# Patient Record
Sex: Female | Born: 1935 | Race: White | Hispanic: No | Smoking: Never smoker
Health system: Southern US, Community
[De-identification: ages and names within clinical notes are randomized; demographics above are authoritative.]

## PROBLEM LIST (undated history)

## (undated) DIAGNOSIS — I10 Essential (primary) hypertension: Secondary | ICD-10-CM

## (undated) DIAGNOSIS — F419 Anxiety disorder, unspecified: Secondary | ICD-10-CM

## (undated) DIAGNOSIS — E785 Hyperlipidemia, unspecified: Secondary | ICD-10-CM

## (undated) DIAGNOSIS — A0472 Enterocolitis due to Clostridium difficile, not specified as recurrent: Secondary | ICD-10-CM

---

## 2015-08-07 IMAGING — MG MAMMOGRAPHY SCREENING BILATERAL 3D TOMOSYNTHESIS WITH CAD
12 of 16 series · 12 of 32 positions shown · non-contrast
Comparison: Exams dating back to [id number]
BREAST DENSITY: (Level B) There are scattered areas of fibroglandular density.

MAMMOGRAPHY SCREENING 3D TOMOSYNTHESIS WITH CAD, 08/07/2015 [DATE]:
CLINICAL INDICATION: Screening
TECHNIQUE: Digital mammograms and 3-D Tomosynthesis were obtained. These were
interpreted both primarily and with the aid of computer-aided detection
system.

[L CC synth-2D]
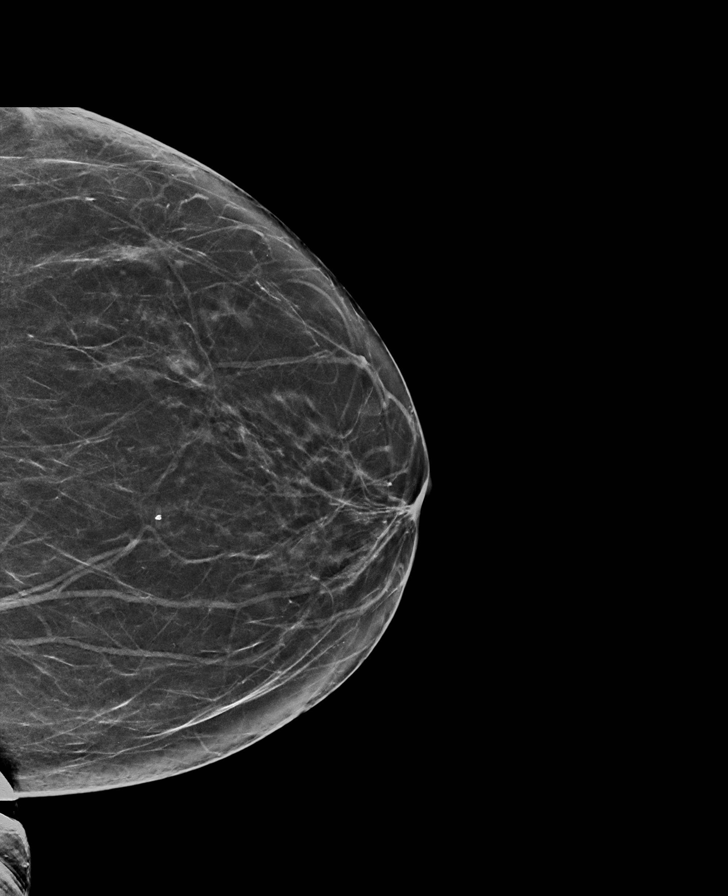

[L CC]
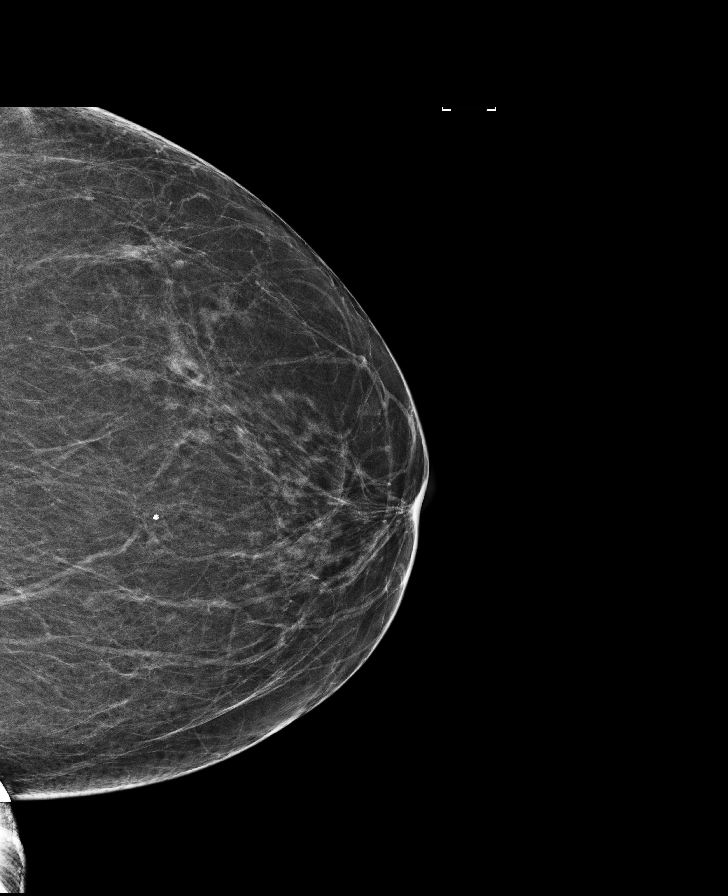

[R CC]
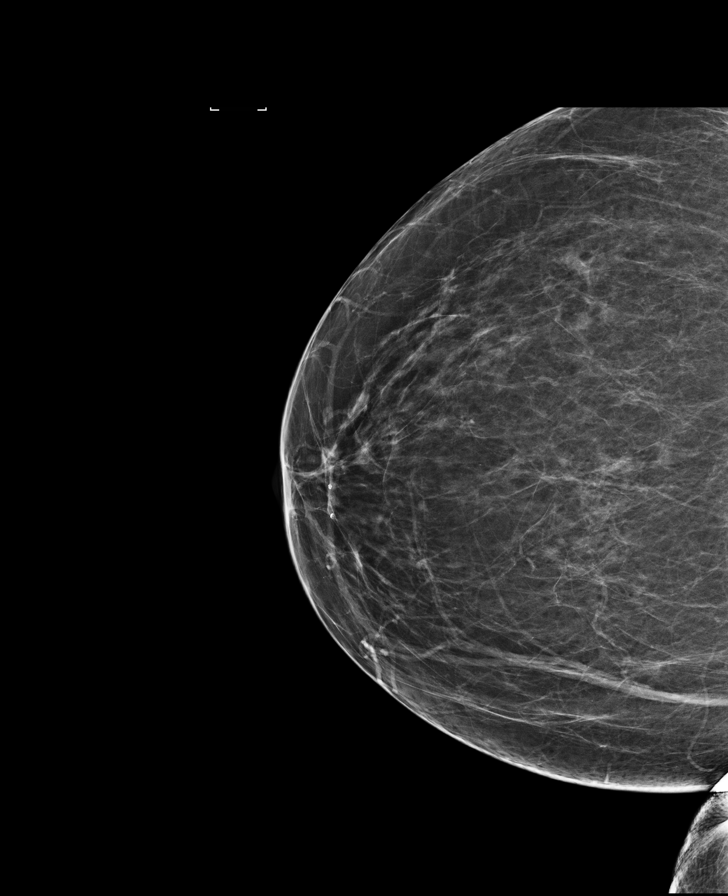

[L MLO]
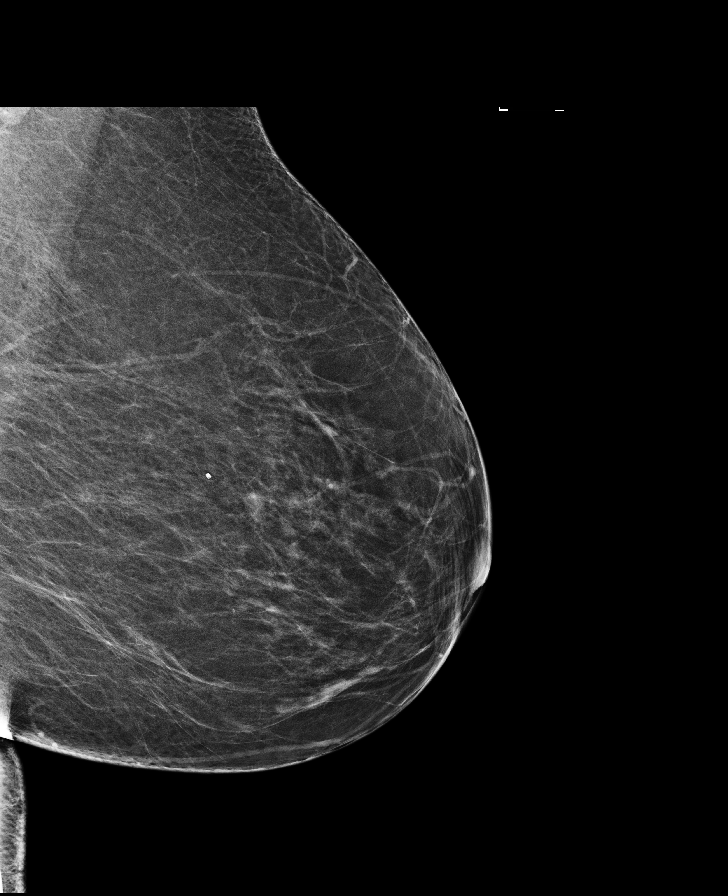

[R CC synth-2D]
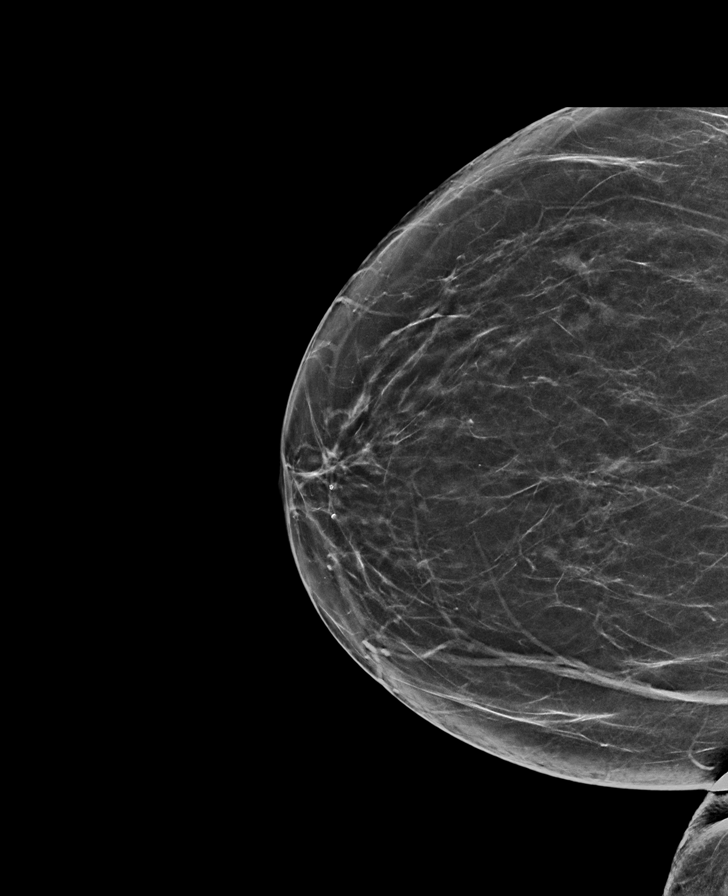

[L MLO synth-2D]
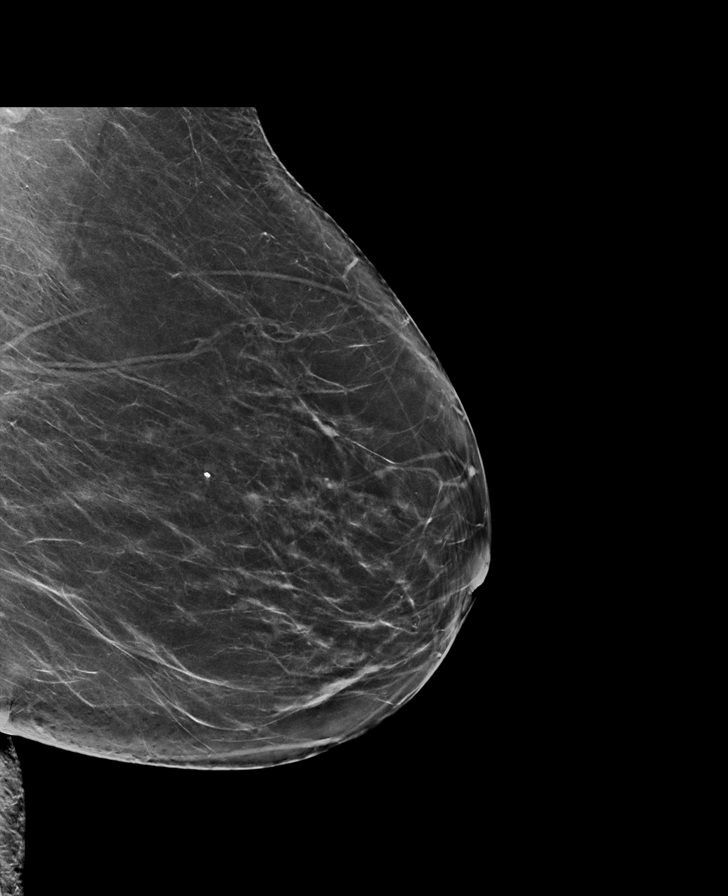

[R MLO synth-2D]
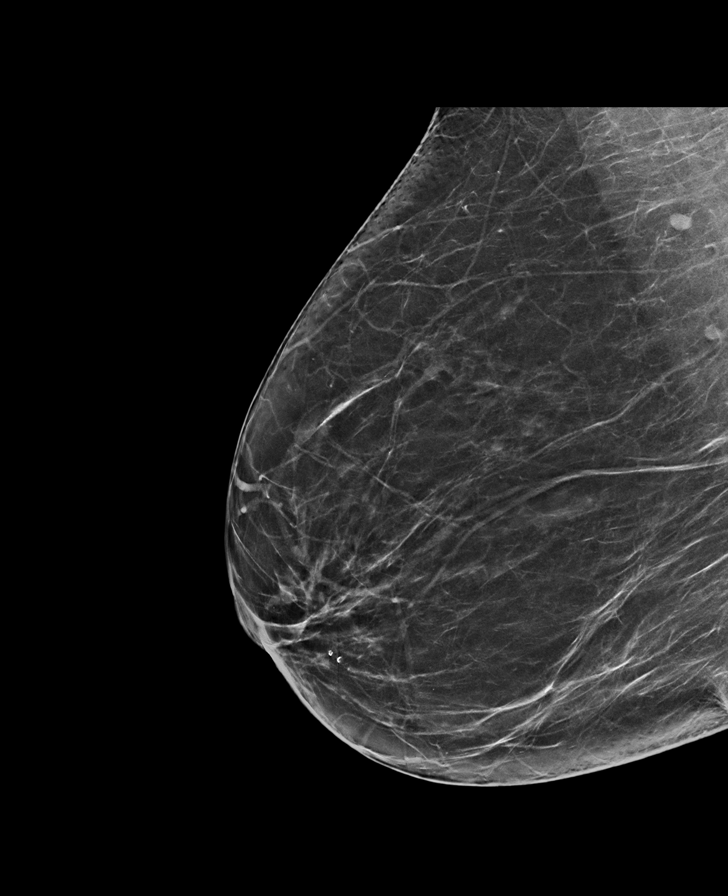

[R MLO]
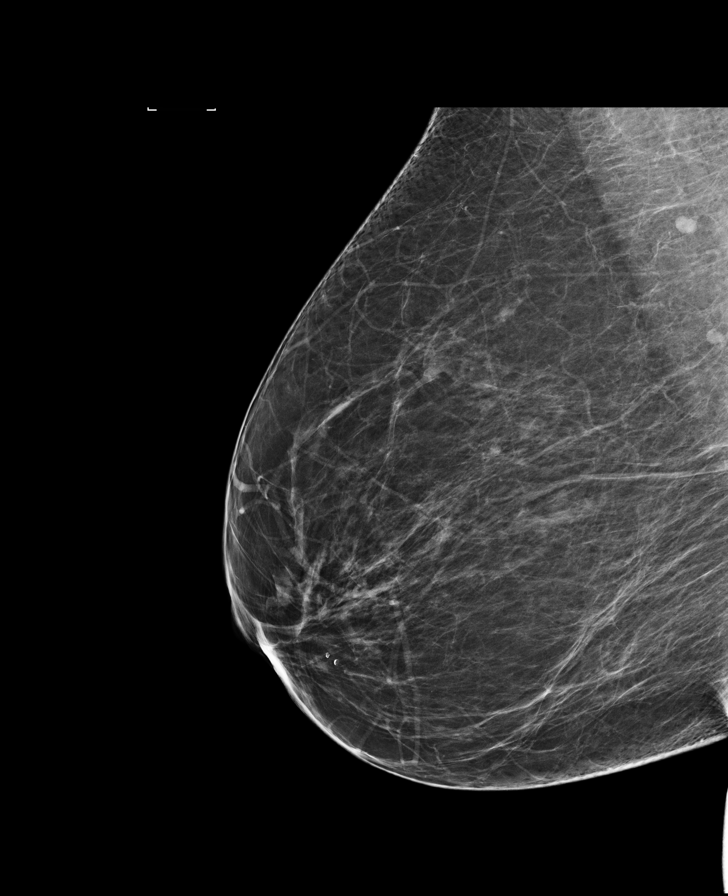

[R MLO tomo]
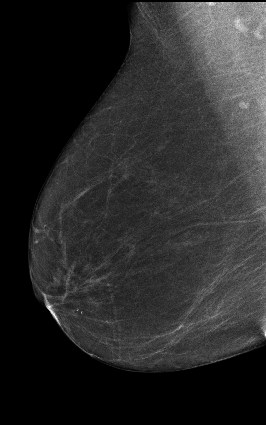

[L CC tomo]
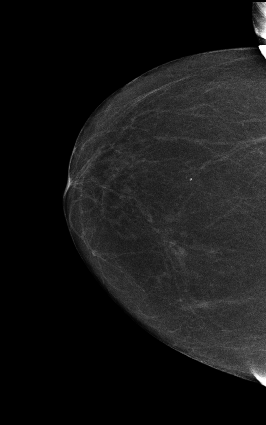

[R CC tomo]
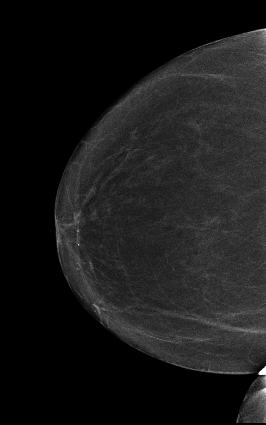

[L MLO tomo]
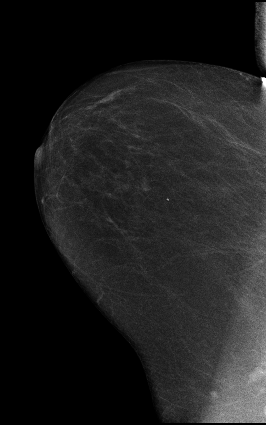

[12 of 32 positions shown; findings below may reference images not displayed]

FINDINGS: There is no evidence of mass or malignant appearing
microcalcifications
seen on today's examination. No skin thickening is identified. There is
thought
to be an overall stable mammographic appearance.
IMPRESSION: ( BI-RADS 2) Benign findings. Routine mammographic follow-up is recommended.

## 2019-08-26 IMAGING — MG MAMMOGRAPHY SCREENING BILATERAL 3D TOMOSYNTHESIS WITH CAD
8 series · 8 of 24 positions shown · non-contrast
Comparison: 07/29/2015 and 12/28/2013. 
BREAST DENSITY: (Level B) There are scattered areas of fibroglandular density.

MAMMOGRAPHY SCREENING BILATERAL 3D TOMOSYNTHESIS WITH CAD, 08/26/2019 [DATE]: 
CLINICAL INDICATION: Screening exam. History of prior left breast excisional 
biopsy.
TECHNIQUE: Digital bilateral mammograms and 3-D Tomosynthesis were obtained. 
These were interpreted both primarily and with the aid of computer-aided 
detection system.

[L CC]
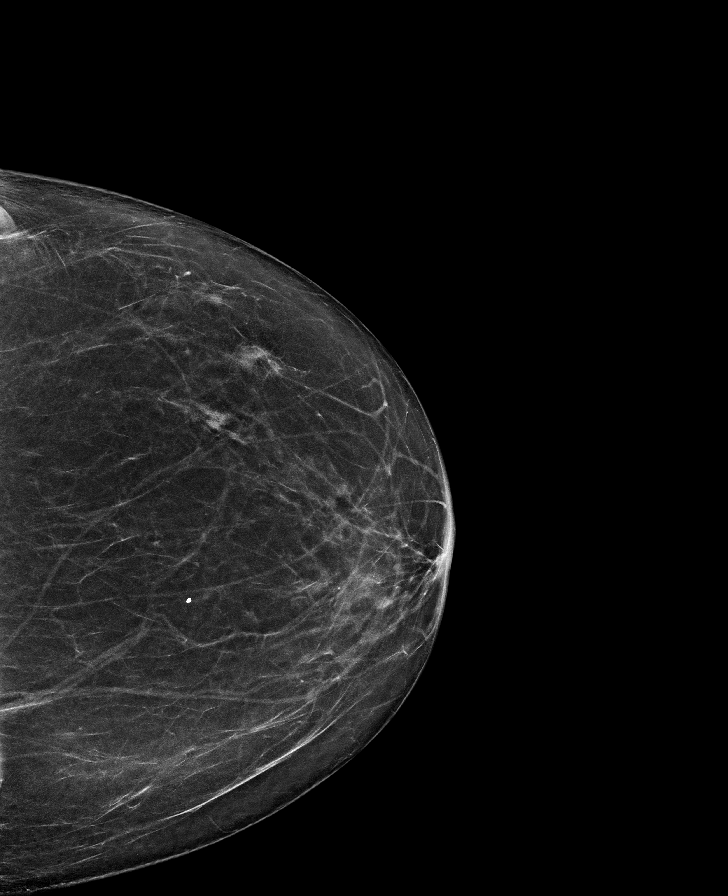

[L MLO]
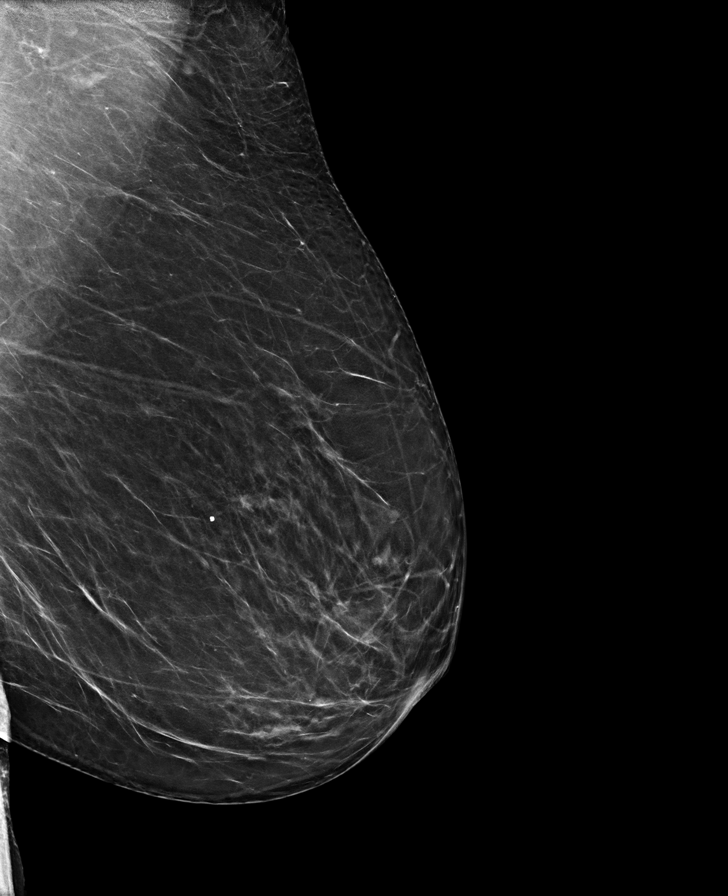

[R MLO]
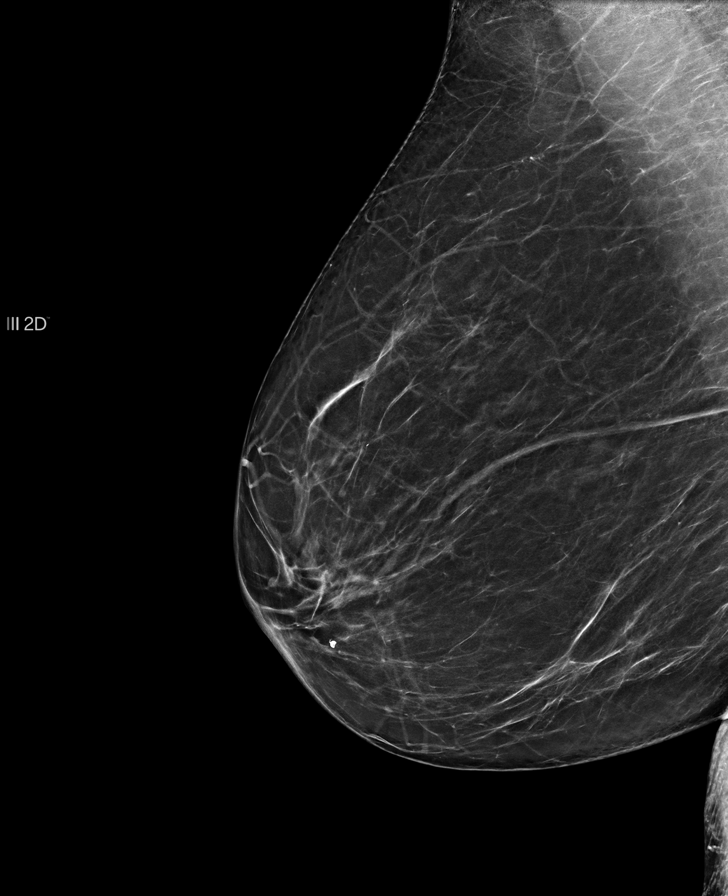

[R CC]
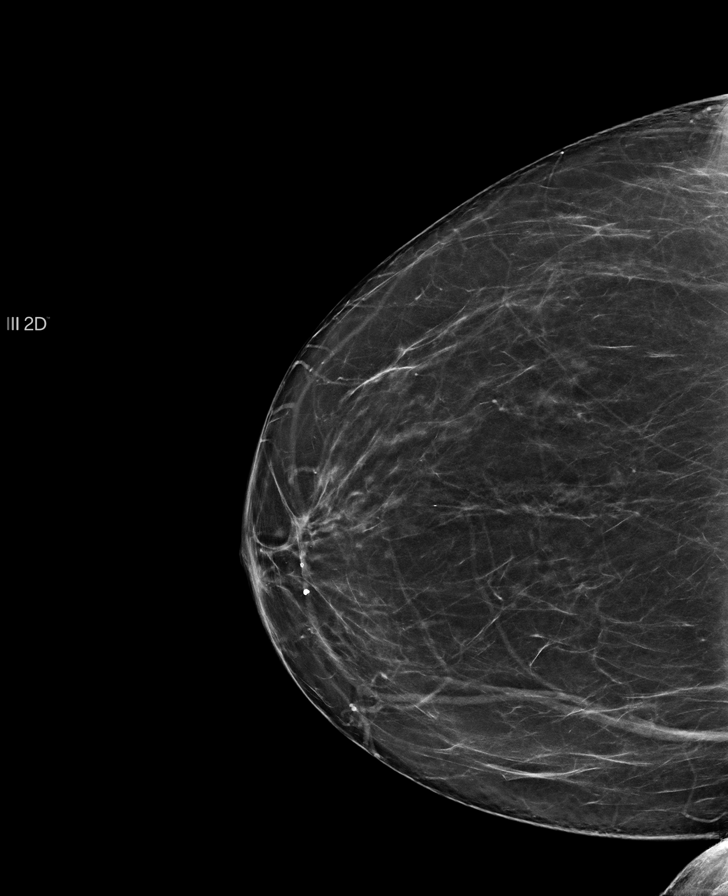

[L MLO tomo · tomo slice 37/73.0]
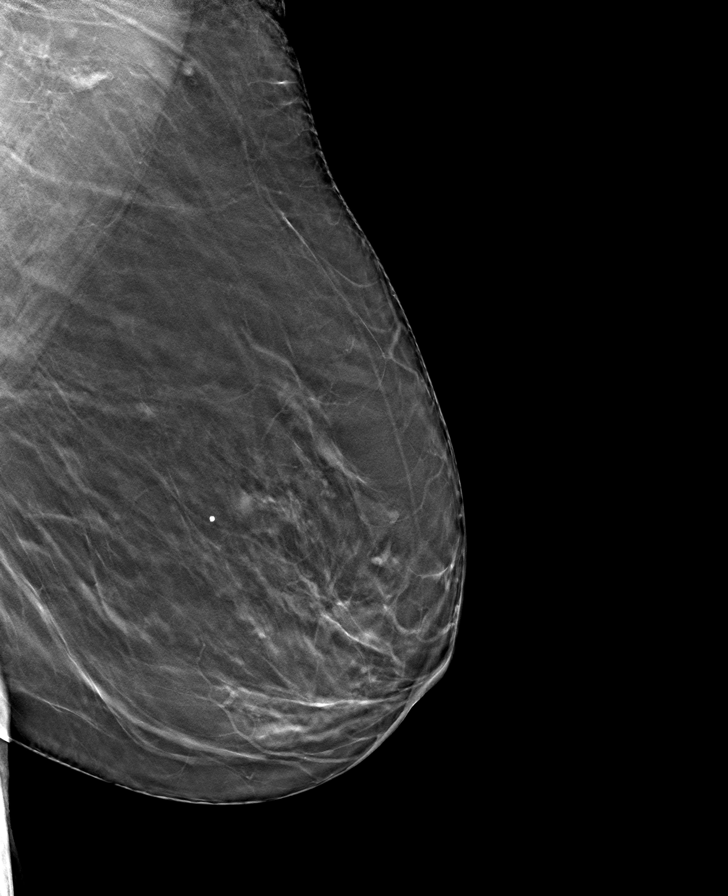

[L CC tomo · tomo slice 33/66.0]
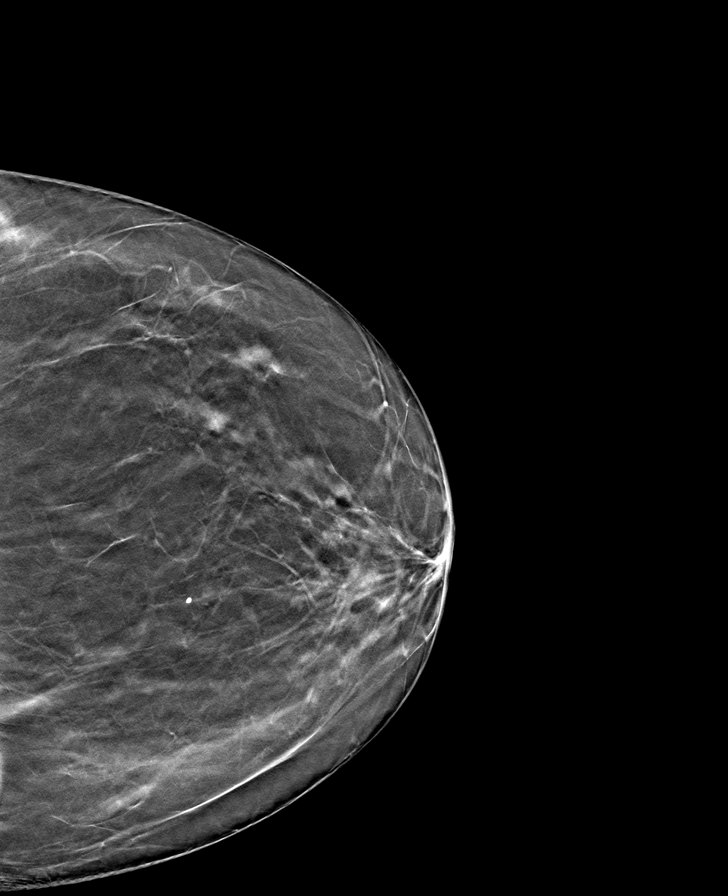

[R CC tomo · tomo slice 34/67.0]
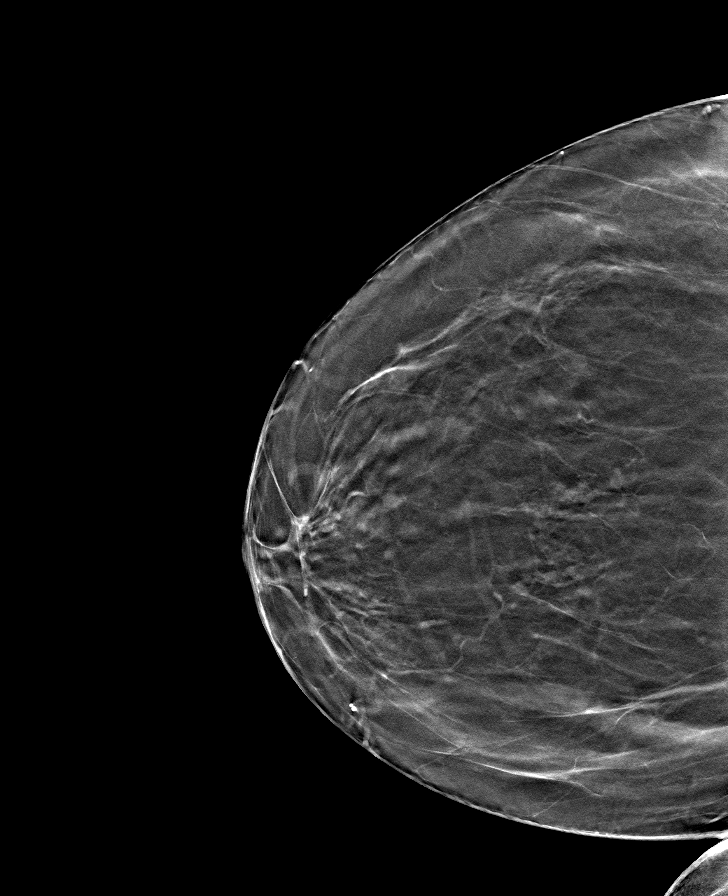

[R MLO tomo · tomo slice 35/69.0]
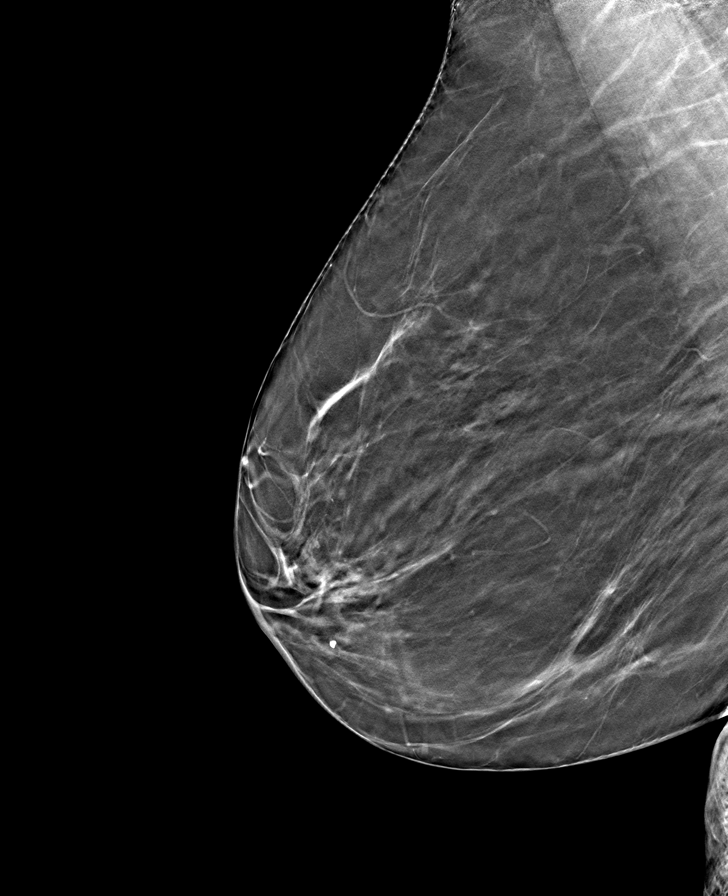

[8 of 24 positions shown; findings below may reference images not displayed]

FINDINGS: Overall stable mammographic appearance. No mammographic findings 
suggestive for malignancy.
IMPRESSION: ( BI-RADS 2) Benign findings. Routine mammographic follow-up is recommended.

## 2020-10-18 IMAGING — CT CT LUMBAR SPINE WITHOUT CONTRAST
3 series · 10 of 33 positions shown, 12 images · non-contrast
Comparison: There are no previous exams available for comparison.

CT LUMBAR SPINE WITHOUT CONTRAST, 10/18/2020 [DATE]: 
CLINICAL INDICATION: Progressive low back pain. 
A search for DICOM formatted images was conducted for prior CT imaging studies 
completed at a non-affiliated media free facility.
TECHNIQUE: The lumbar spine was scanned from T12 through mid-sacrum without 
contrast on a high-resolution CT scanner using dose reduction techniques. 
Routing MPR reconstructions were performed.

[Series 3: l spine 2.0 b31s · axial · 0.27mm/px · z∈[-240,-132]mm · 2 of 119 slices shown, 3 images]
[im 37/119  soft-tissue]
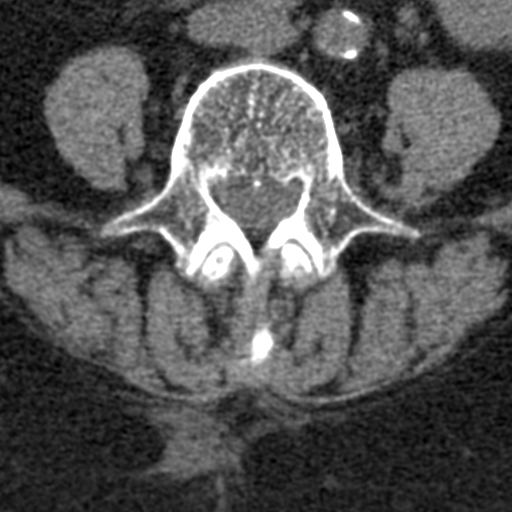
[im 37/119  bone]
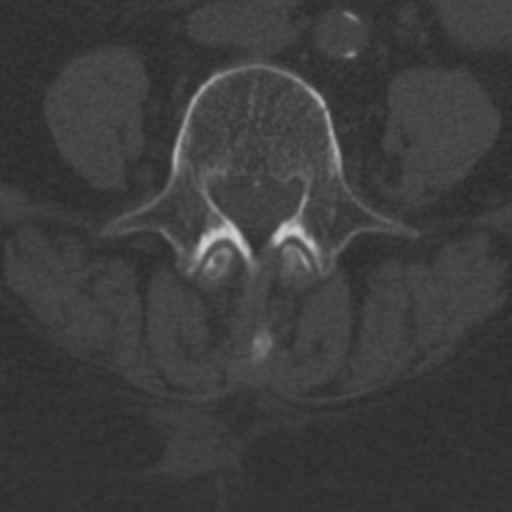
[im 91/119  bone]
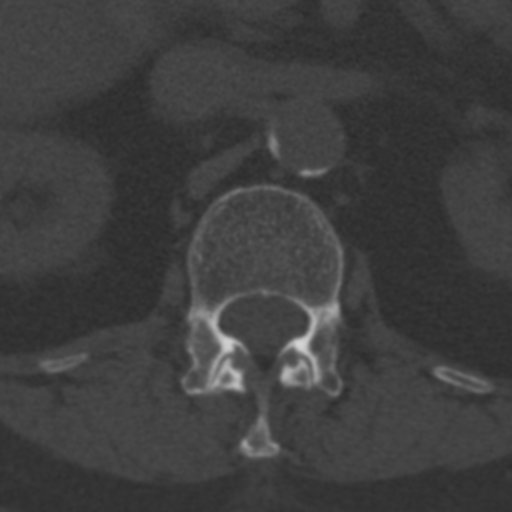

[Series 5: coronal · coronal · 0.28mm/px · 3 of 70 slices shown]
[im 14/70  bone]
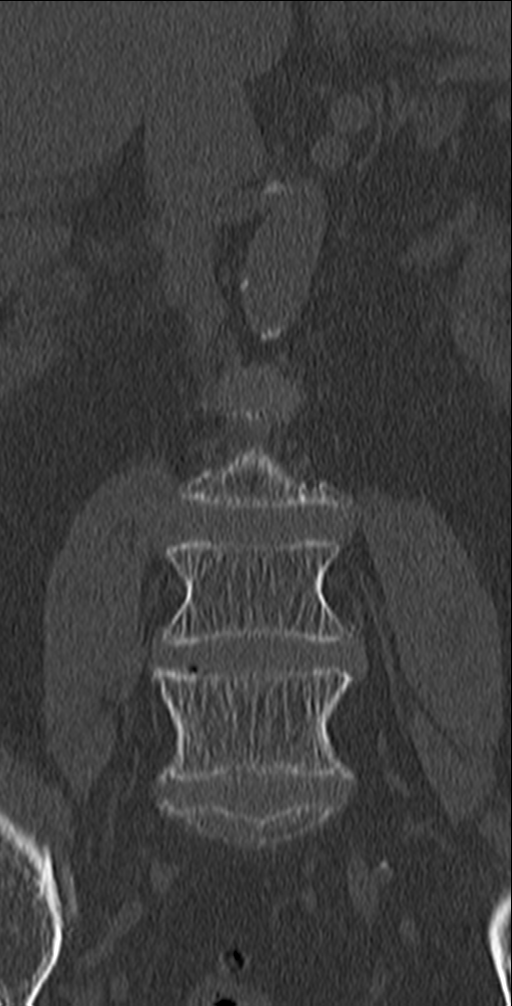
[im 28/70  bone]
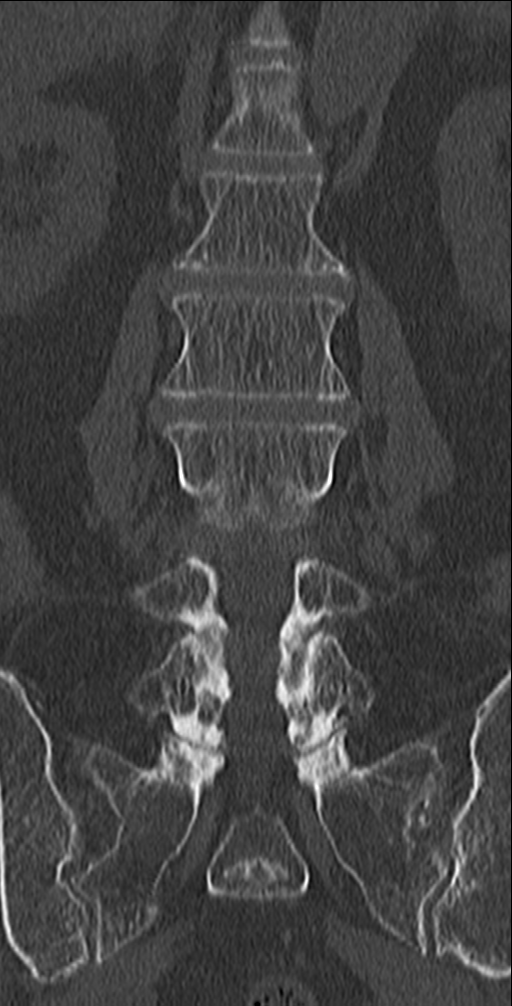
[im 42/70  bone]
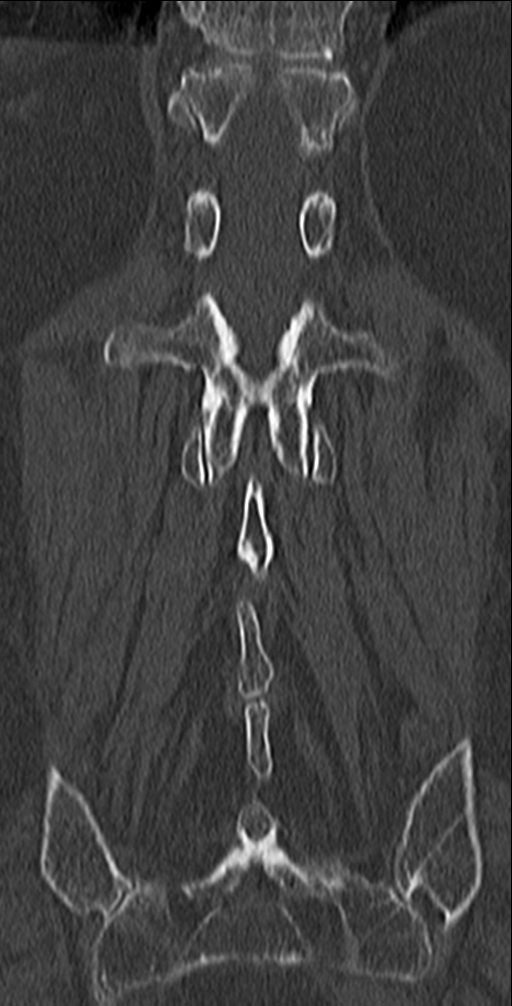

[Series 7: sagittal st · sagittal · 0.30mm/px · 5 of 71 slices shown, 6 images]
[im 24/71  bone]
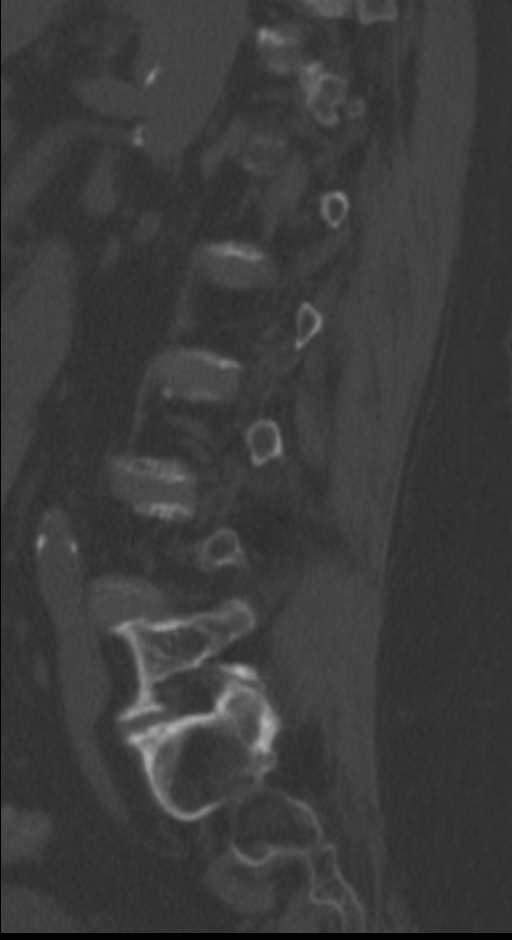
[im 30/71  bone]
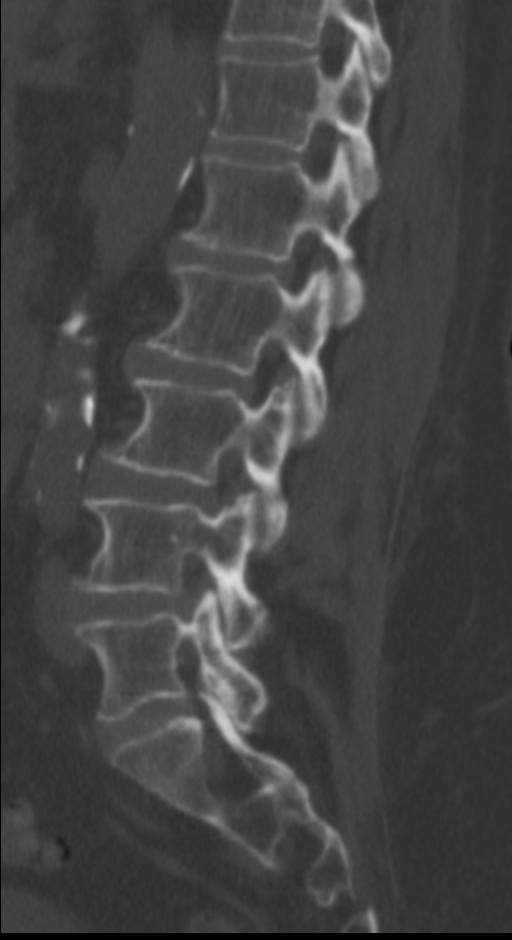
[im 36/71  soft-tissue]
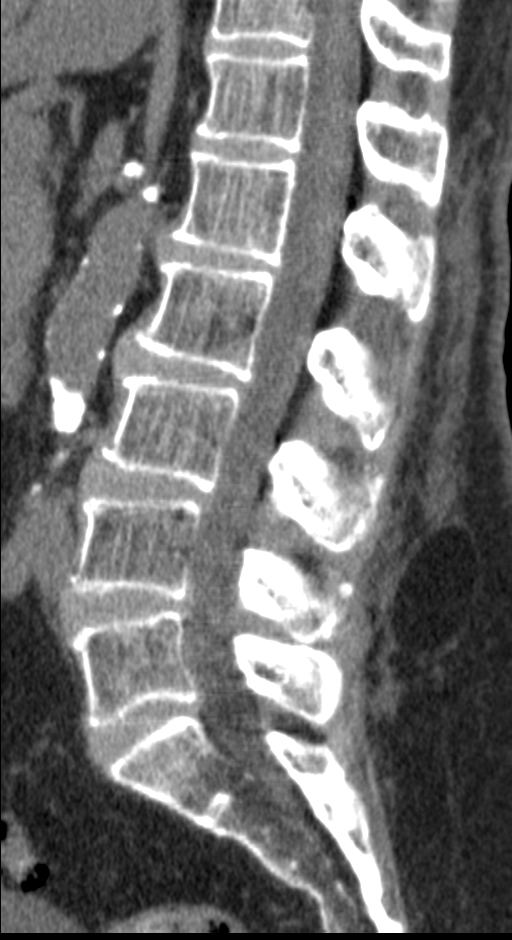
[im 36/71  bone]
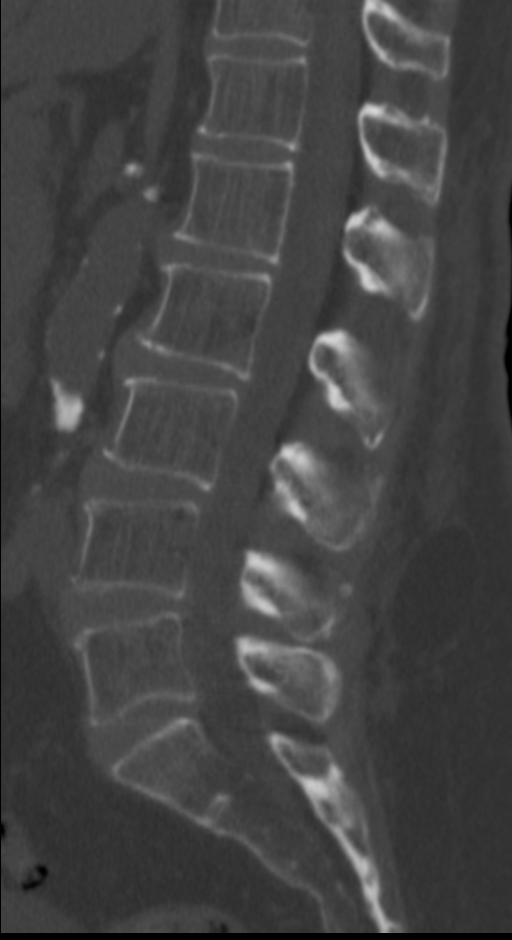
[im 41/71  bone]
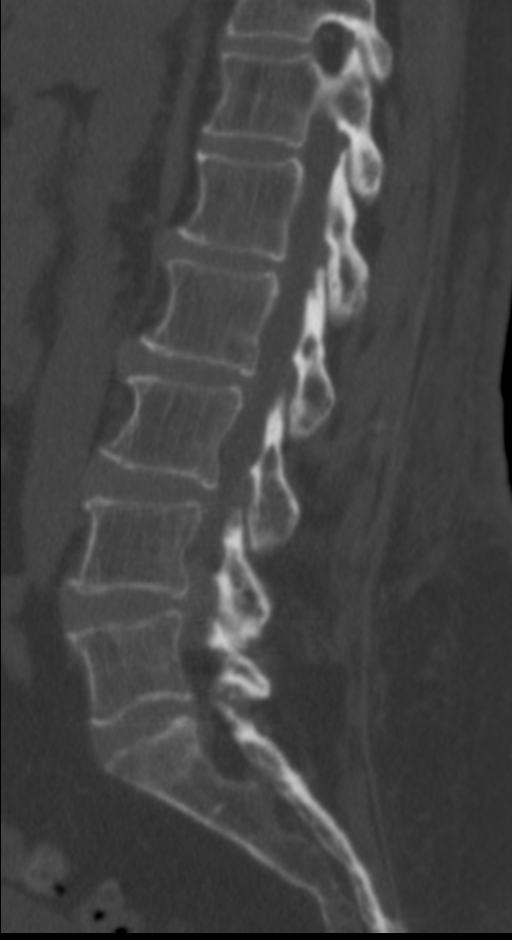
[im 47/71  bone]
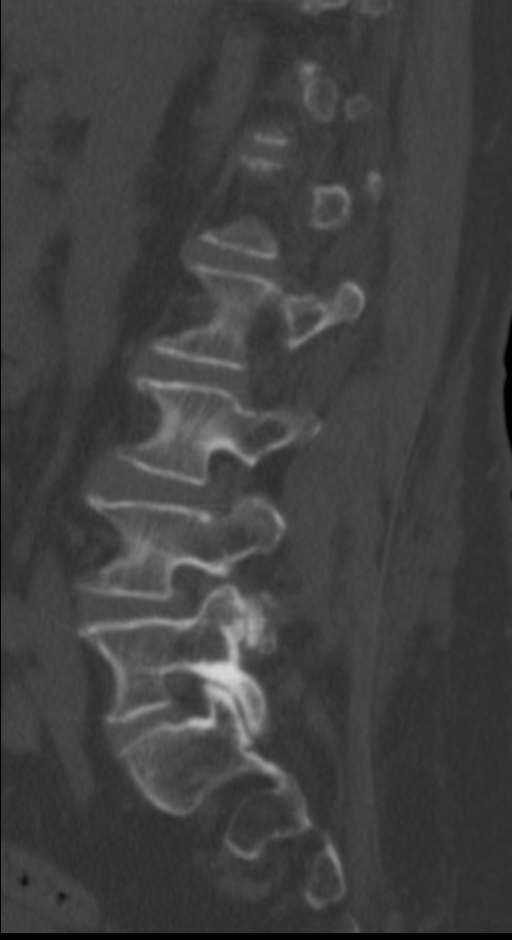

[10 of 33 positions shown; findings below may reference images not displayed]

FINDINGS: The vertebral bodies are diffusely osteopenic but normal in height and 
alignment. No vertebral body fracture. No subluxation. No osteolytic or 
osteosclerotic lesion. Included portions of the SI joints are negative. The 
aorta is normal in caliber. Paraspinal soft tissues are negative.. Mild/moderate 
degenerative change sacroiliac joint spaces. Right renal cyst. 
Disc levels are as follows given the intrinsic resolution of the CT exam: 
Mild to moderate spondylosis greatest in the posterior elements.
IMPRESSION: 1.  No fracture or subluxation. 
2.  Mild to moderate spondylosis greatest in the posterior elements. 
RADIATION DOSE REDUCTION: All CT scans are performed using radiation dose 
reduction techniques, when applicable.  Technical factors are evaluated and 
adjusted to ensure appropriate moderation of exposure.  Automated dose 
management technology is applied to adjust the radiation doses to minimize 
exposure while achieving diagnostic quality images.

## 2020-10-18 IMAGING — DX LUMBAR SPINE AP & LAT WITH FLEXION AND EXTENSION
1 series · 4 of 4 positions shown · non-contrast
Comparison: None.

LUMBAR SPINE AP \T\T\T\ LAT WITH FLEXION AND EXTENSION, 10/18/2020 [DATE]: 
CLINICAL INDICATION: Back pain.

[Series 1: AP · 0.14mm/px · 4 of 4 slices shown]
[im 1/4]
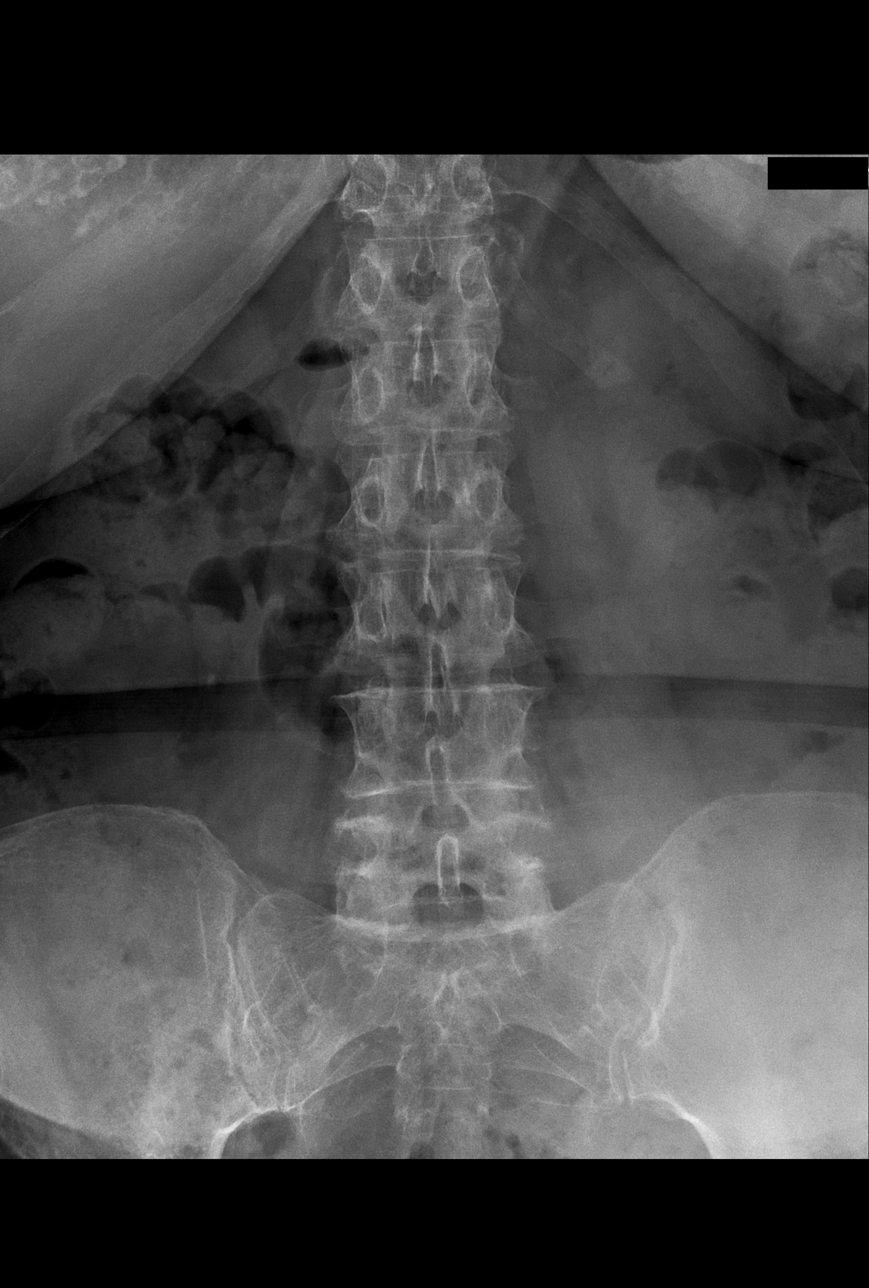
[im 2/4]
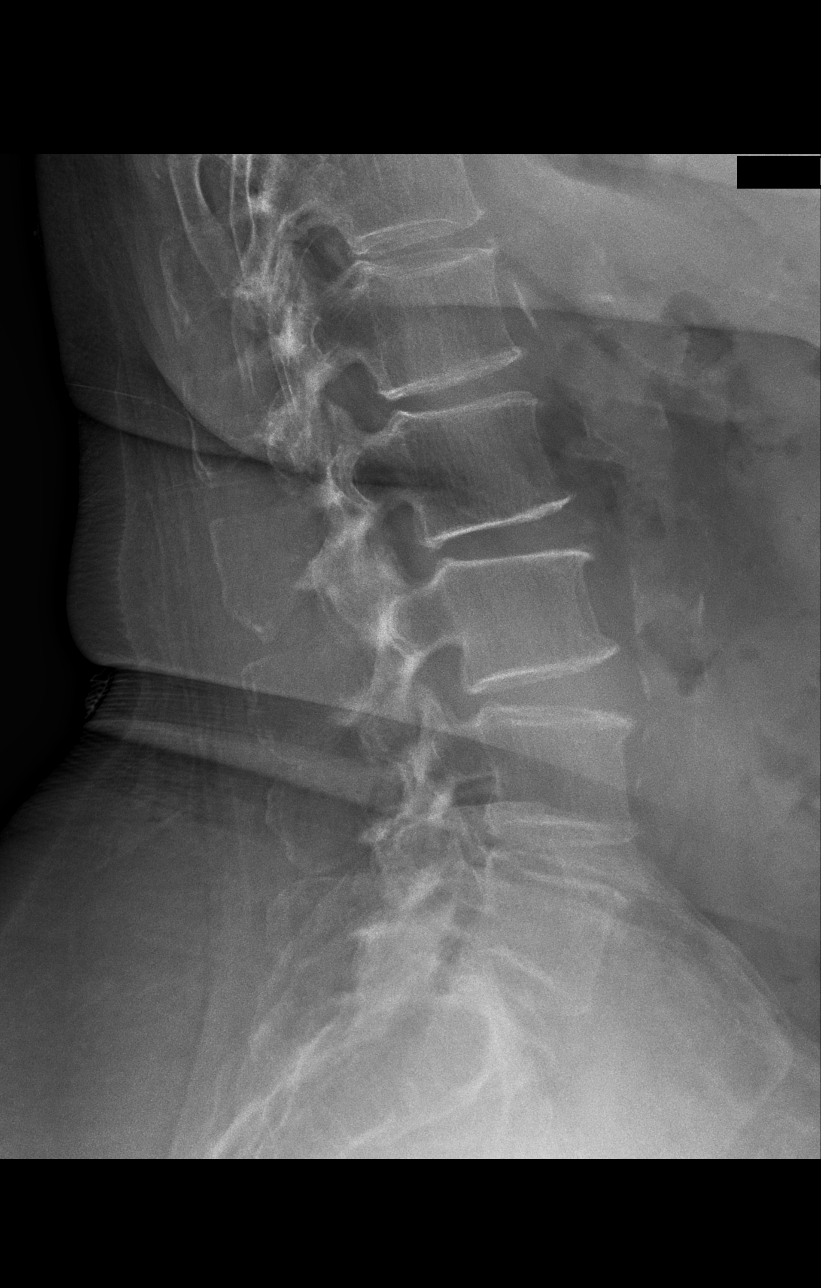
[im 3/4]
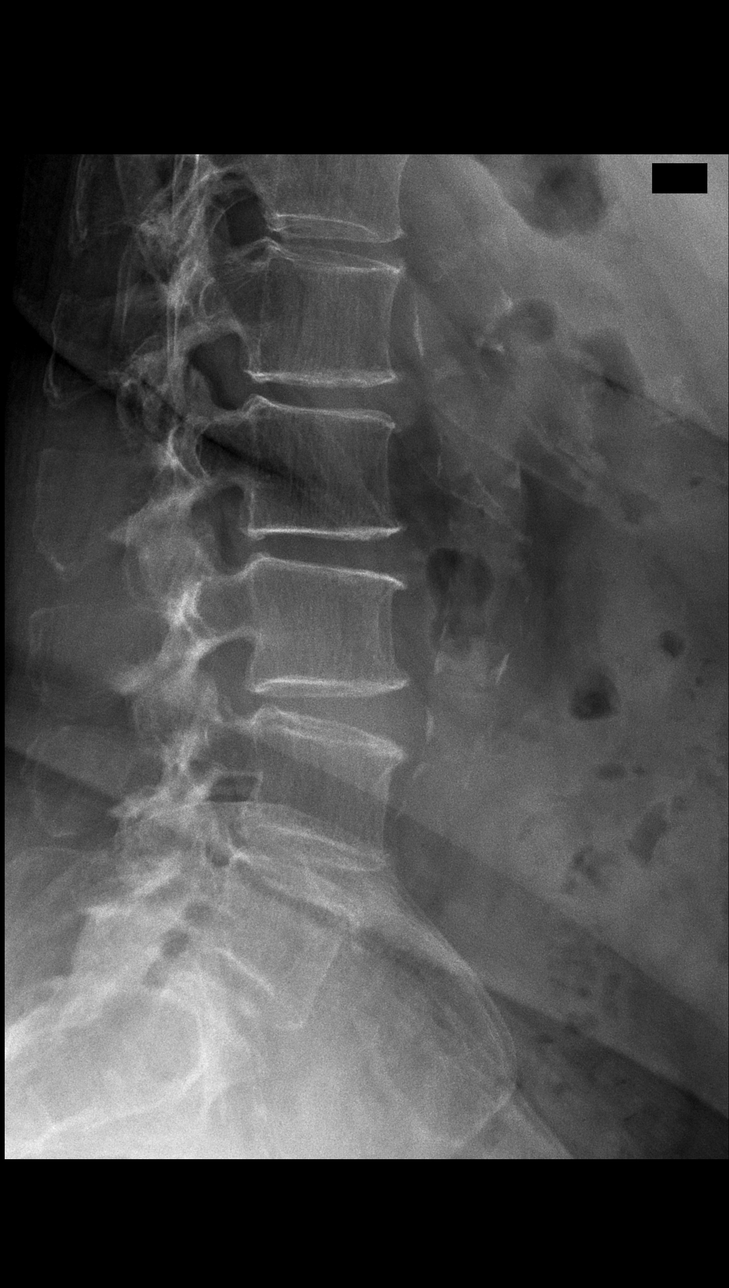
[im 4/4]
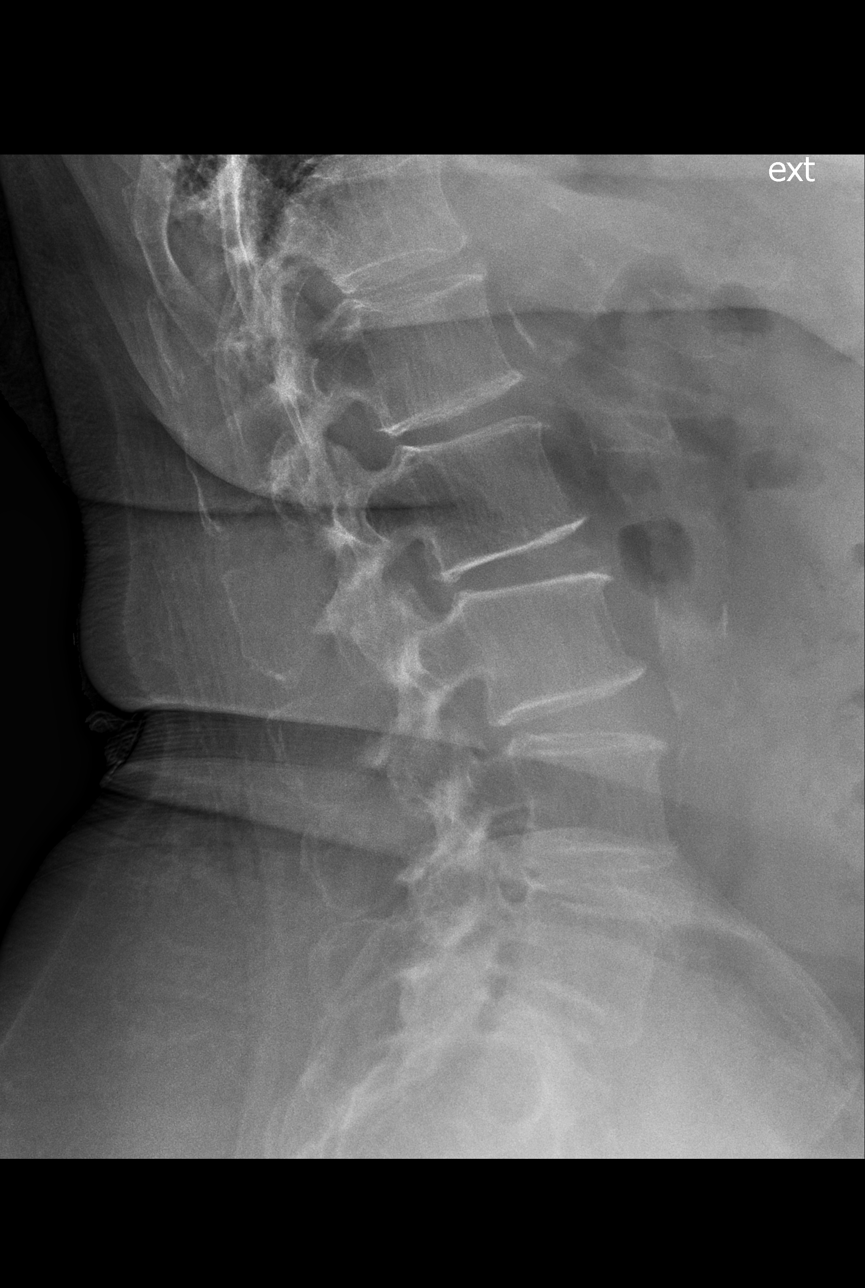

[4 of 4 positions shown; findings below may reference images not displayed]

FINDINGS: Bones are diffusely osteopenic. Mild to moderate spondylosis. No fracture or 
subluxation. Moderate vascular calcification. Mild/moderate degenerative change 
sacroiliac joint spaces.
IMPRESSION: Mild to moderate spondylosis. No fracture or subluxation.

## 2020-10-18 IMAGING — MR MRI LUMBAR SPINE WITHOUT CONTRAST
4 of 6 series · 23 of 48 positions shown · IV contrast (gadolinium)
Comparison: None.

MRI LUMBAR SPINE WITHOUT CONTRAST, 10/18/2020 [DATE]: 
CLINICAL INDICATION: Low back pain radiating to the left hip and buttocks.
TECHNIQUE: Multiplanar, multiecho position MR images of the lumbar spine were 
performed without intravenous gadolinium enhancement.

[Series 101: survey · axial · 10.0mm · 1.39mm/px · z∈[-15,+199]mm · 3 of 14 slices shown]
[im 1/14]
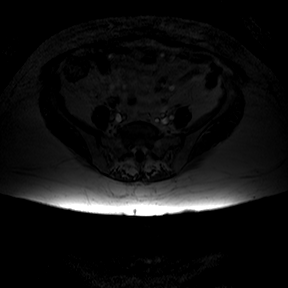
[im 9/14]
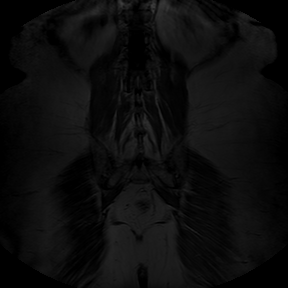
[im 14/14]
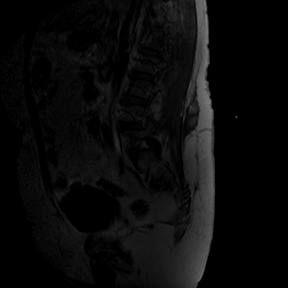

[Series 301: t1w tse sag · sagittal · 4.0mm · 0.50mm/px · 3 of 17 slices shown]
[im 4/17]
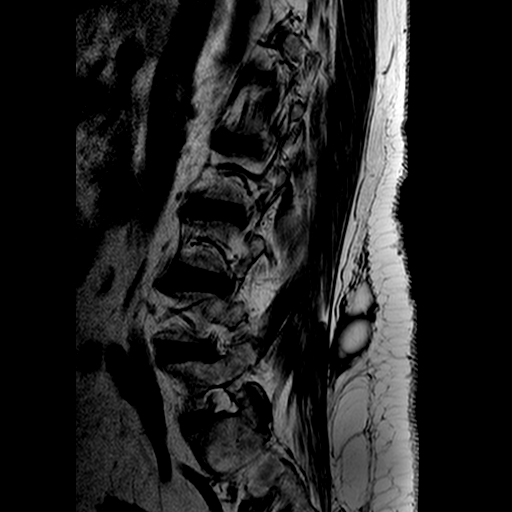
[im 10/17]
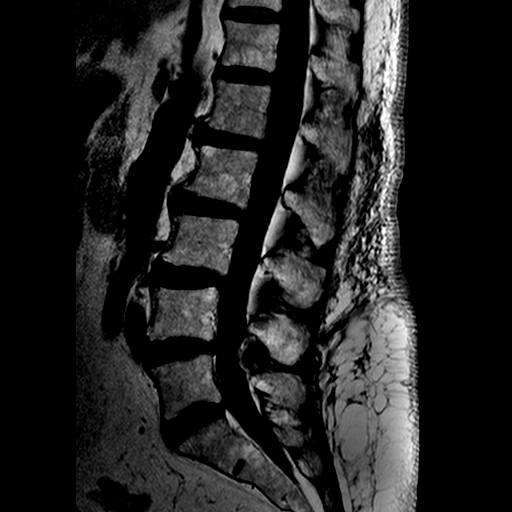
[im 17/17]
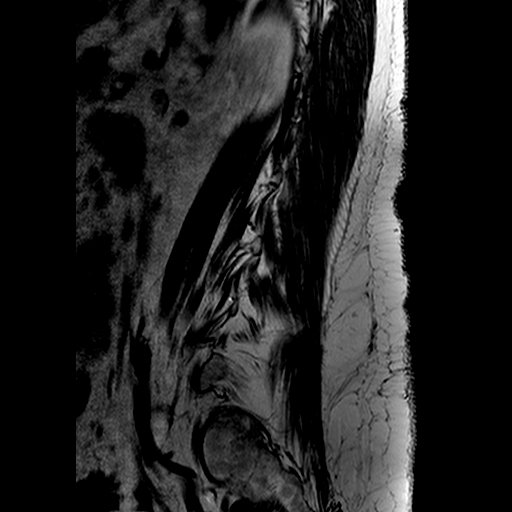

[Series 601: T2 · axial · 4.0mm · 0.47mm/px · z∈[-51,+184]mm · 11 of 30 slices shown]
[im 1/30]
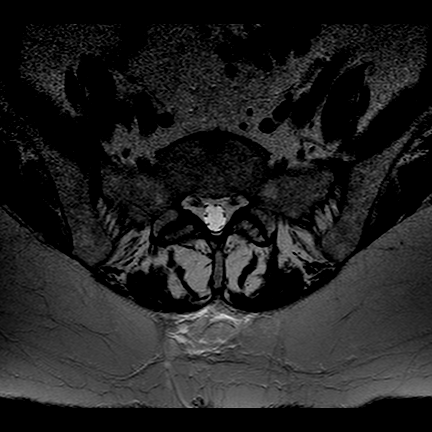
[im 3/30]
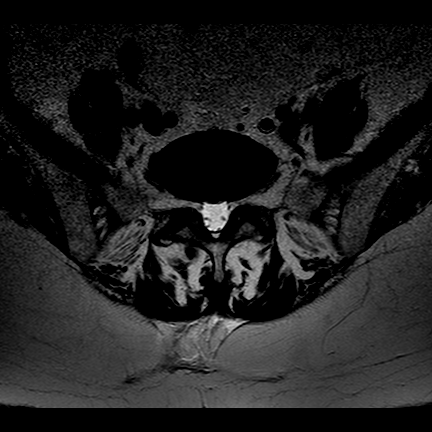
[im 6/30]
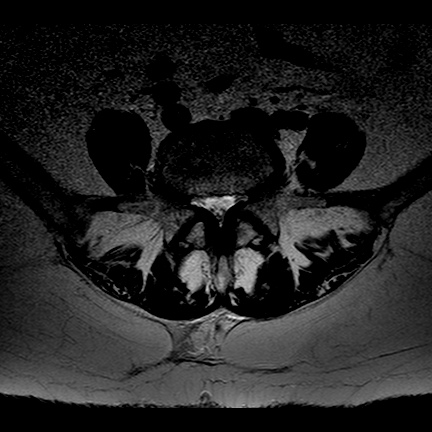
[im 9/30]
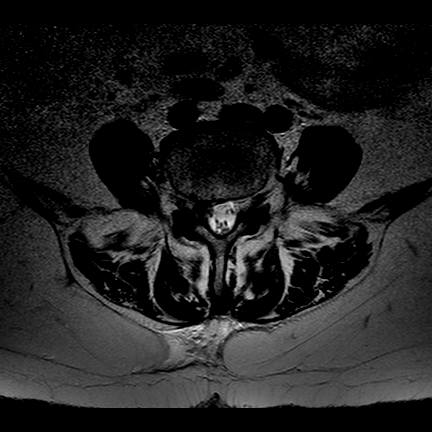
[im 12/30]
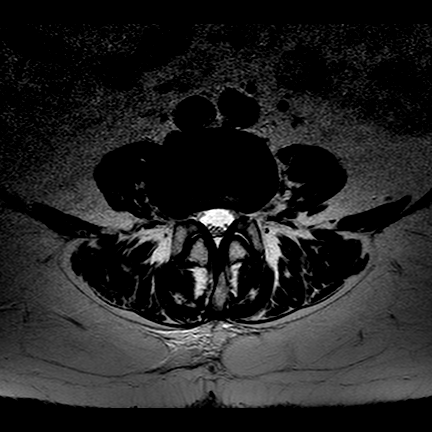
[im 15/30]
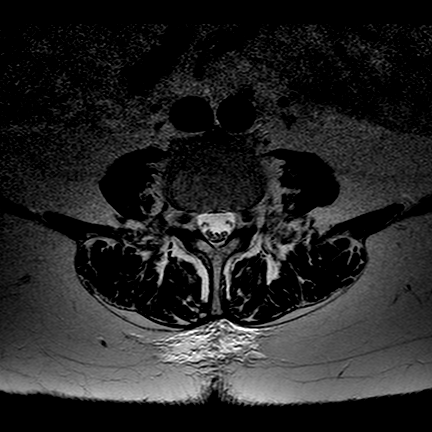
[im 18/30]
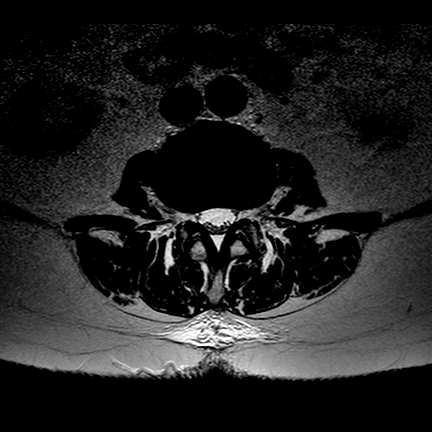
[im 21/30]
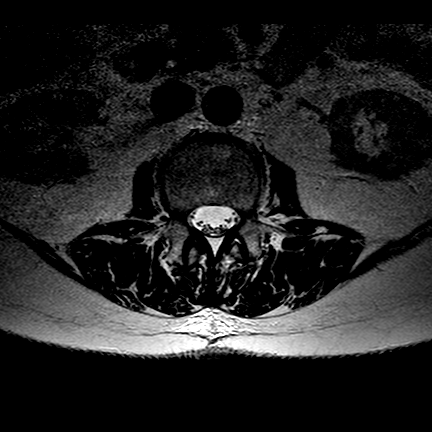
[im 24/30]
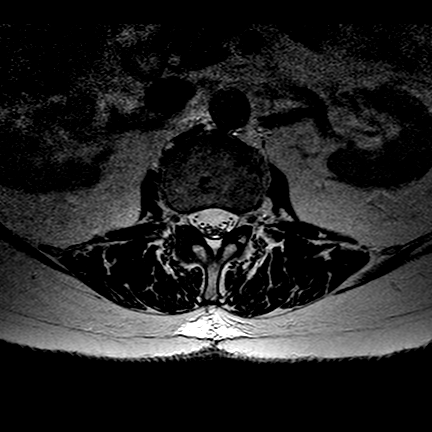
[im 27/30]
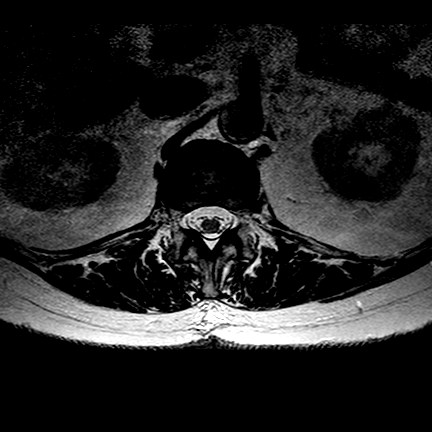
[im 30/30]
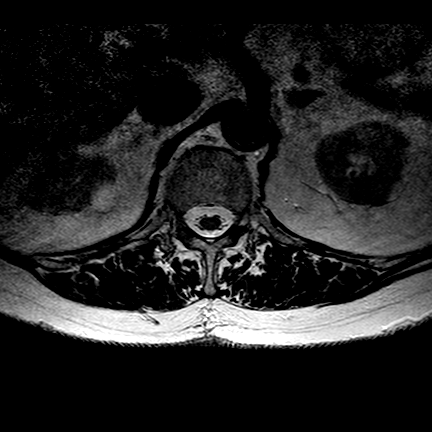

[Series 701: T1 · axial · 4.0mm · 0.35mm/px · z∈[-50,+162]mm · 6 of 42 slices shown]
[im 1/42]
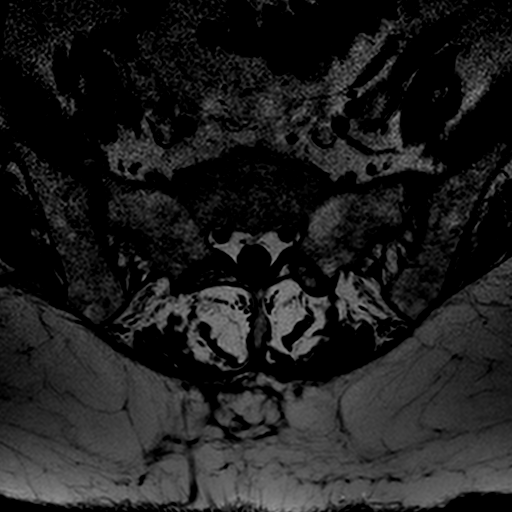
[im 6/42]
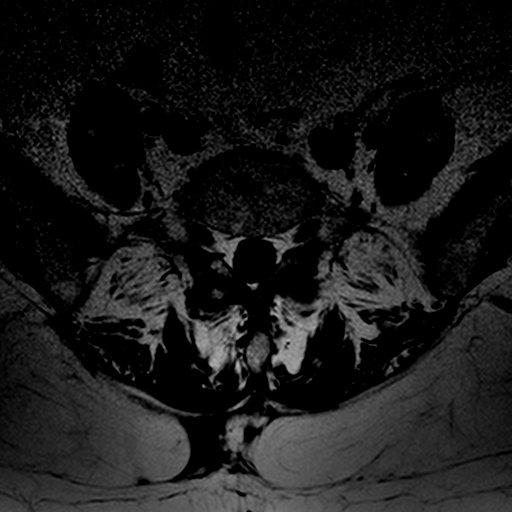
[im 12/42]
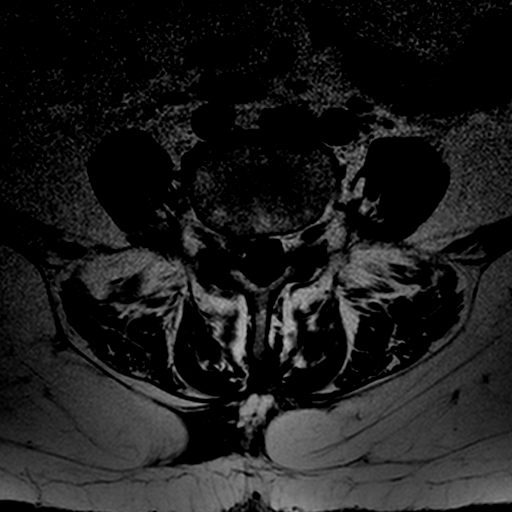
[im 18/42]
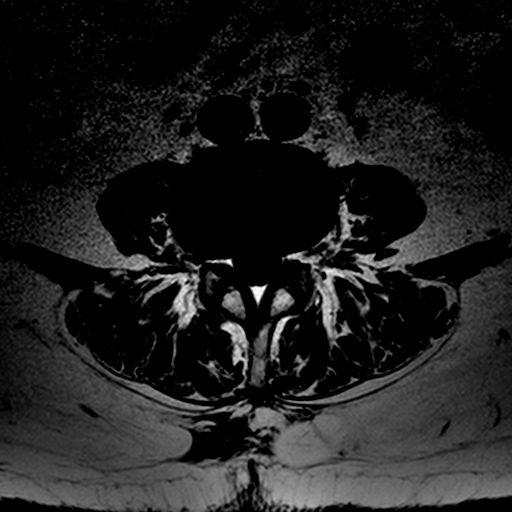
[im 21/42]
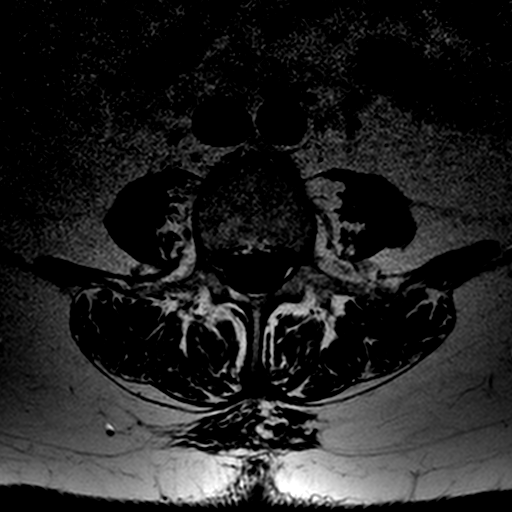
[im 36/42]
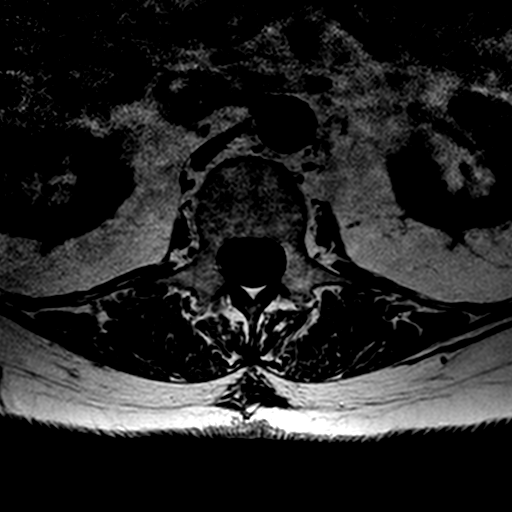

[23 of 48 positions shown; findings below may reference images not displayed]

FINDINGS: Nomenclature is based on 5 lumbar type vertebral bodies. The vertebral 
bodies are normal in height and alignment. No acute vertebral body fracture. No 
spondylolisthesis. The conus tip terminates at the L1 vertebral body level.The 
aorta is normal in diameter. The posterior paraspinal soft tissues are negative. 
Renal cysts. 
T12-L1: The disc is normal in height and signal. No disc herniation. Normal 
facets. No spinal canal or neural foraminal stenosis. 
L1-L2: The disc is normal in height and signal. No disc herniation. Mild 
degenerative change in the facet joints. No spinal canal or neural foraminal 
stenosis. 
L2-L3: The disc is normal in height and signal. No disc herniation. 
Mild/moderate degenerative change in the facet joints.. No spinal canal or 
neural foraminal stenosis. 
L3-L4: Broad-based bulging annulus. No significant focal protrusion of disc 
material. Moderate degenerative change in the facet joints. Mild spinal stenosis 
but no significant foraminal stenosis.. 
L4-L5: Broad-based bulging annulus. No significant focal protrusion of disc 
material. Moderate/moderate severe degenerative change within the facet joints. 
Moderate spinal stenosis and mild bilateral foraminal stenosis.. 
L5-S1: Mild broad-based bulging annulus. No significant focal protrusion of disc 
material. Moderate severe degenerative change within the facet joints. 
Borderline canal stenosis. No significant foraminal stenosis..
IMPRESSION: 1.  No significant focal protrusion of disc material. Broad-based bulging 
annulus L3-4, L4-5 and L5-S1. 
2.  Mild to severe spondylosis with borderline to moderate spinal stenosis which 
is greatest at L4-5. 
3.  No fracture.

## 2020-11-08 IMAGING — CT CT ABDOMEN AND PELVIS WITHOUT IV CONTRAST WITH ORAL CONTRAST
2 of 3 series · 15 of 46 positions shown, 17 images · non-contrast
Comparison: There are no previous exams available for comparison.

CT ABDOMEN AND PELVIS WITHOUT IV CONTRAST WITH ORAL CONTRAST, 11/08/2020 [DATE]: 
CLINICAL INDICATION:  Abdominal bloating 
The patient's eGFR was calculated to be 35.8 using the i-STAT device. 
A search for DICOM formatted images was conducted for prior CT imaging studies 
completed at a non-affiliated media free facility.
TECHNIQUE: The abdomen and pelvis were scanned from lung bases through the 
pubic rami without contrast on a high-resolution CT scanner using dose reduction 
techniques..  Routine MPR reconstructions were performed.

[Series 2: abd/pel ax wo · axial · 0.98mm/px · z∈[-506,-62]mm · 12 of 170 slices shown, 14 images]
[im 11/170  soft-tissue]
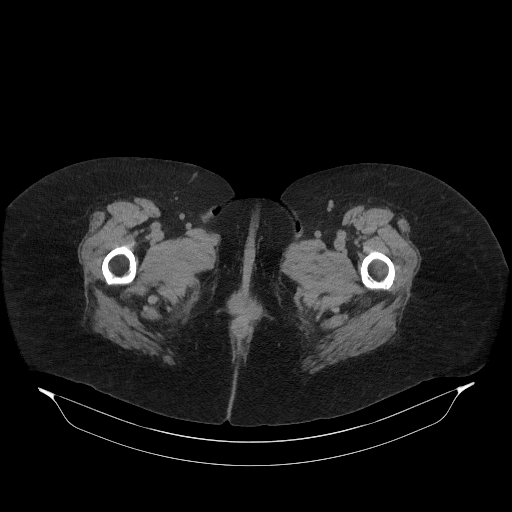
[im 11/170  bone]
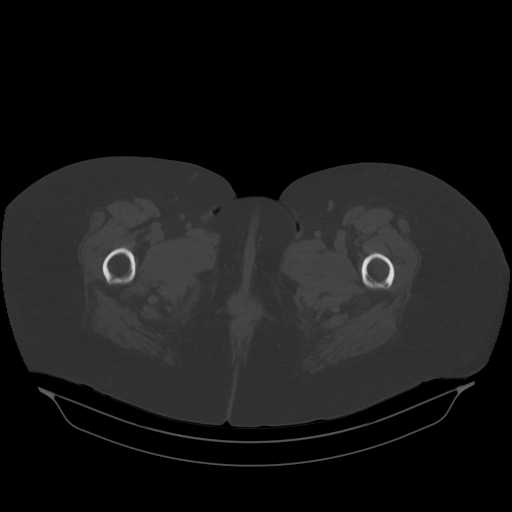
[im 22/170  soft-tissue]
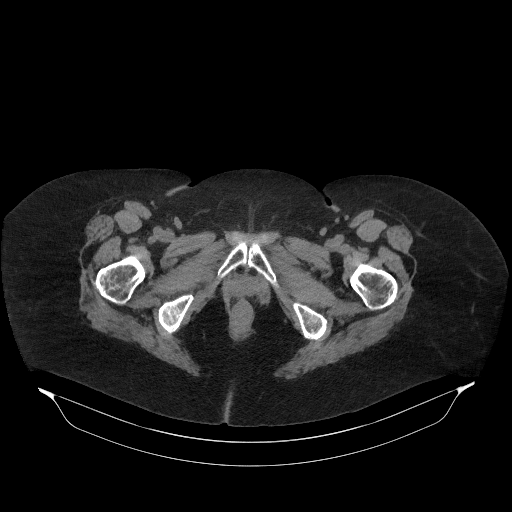
[im 39/170  soft-tissue]
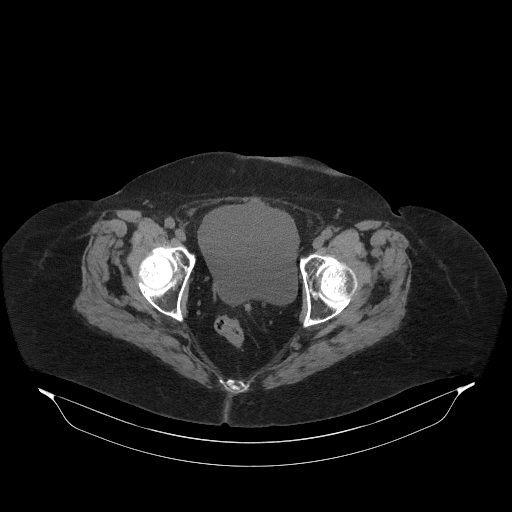
[im 50/170  soft-tissue]
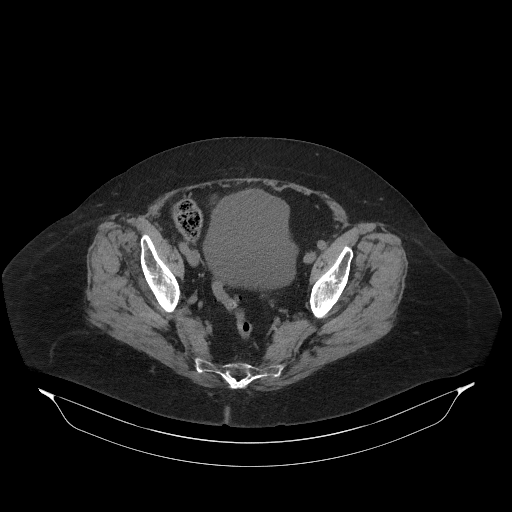
[im 66/170  soft-tissue]
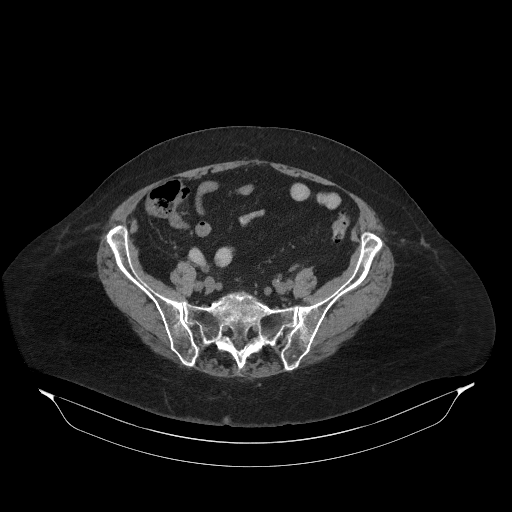
[im 77/170  soft-tissue]
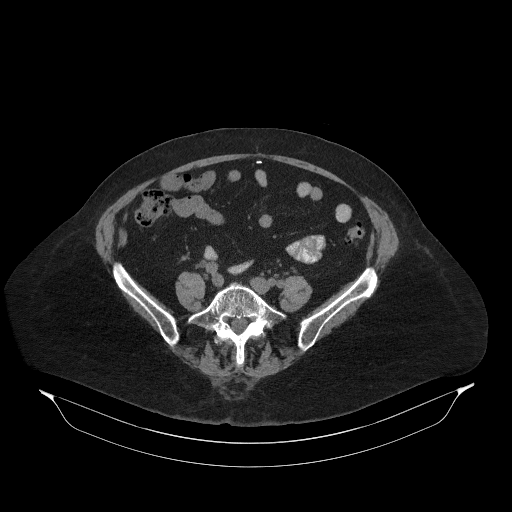
[im 93/170  soft-tissue]
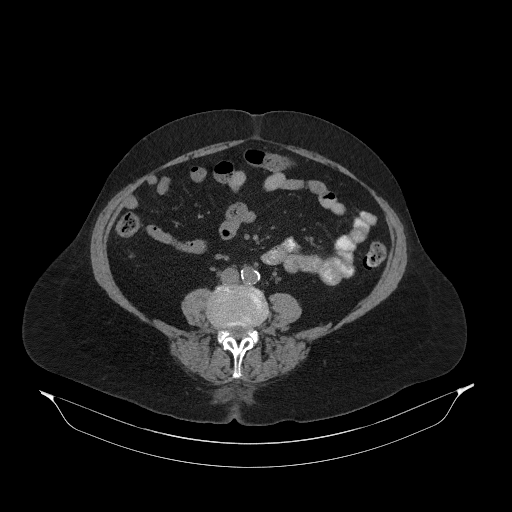
[im 104/170  soft-tissue]
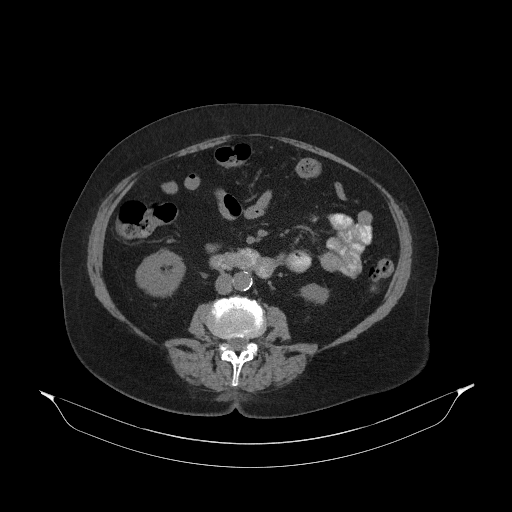
[im 120/170  soft-tissue]
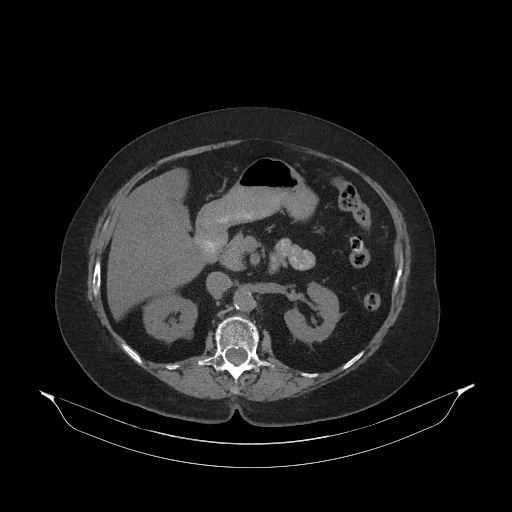
[im 120/170  bone]
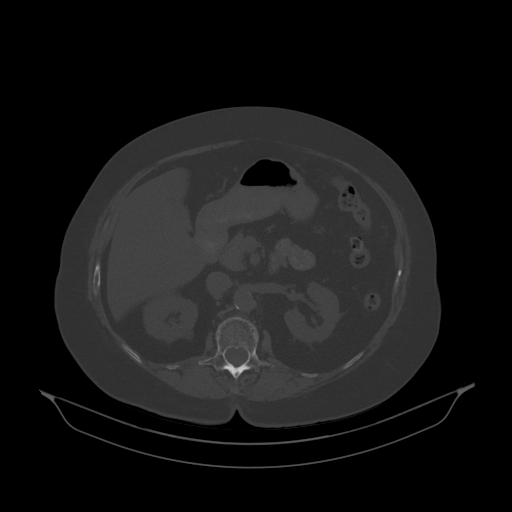
[im 131/170  soft-tissue]
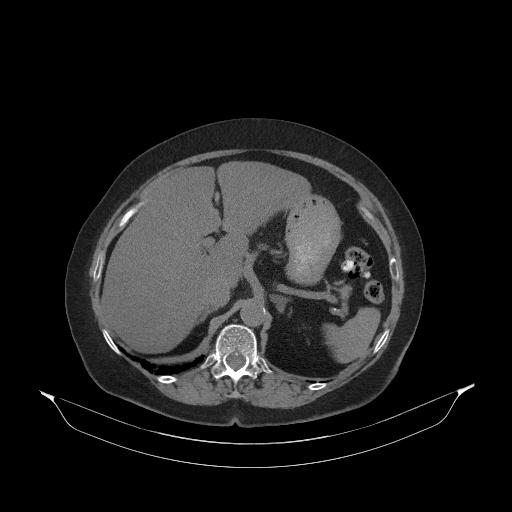
[im 148/170  soft-tissue]
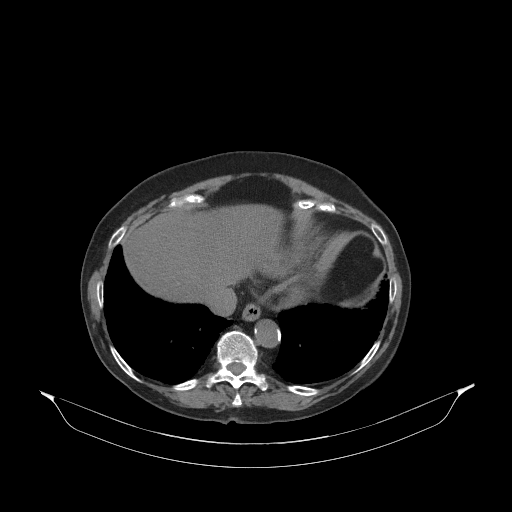
[im 159/170  soft-tissue]
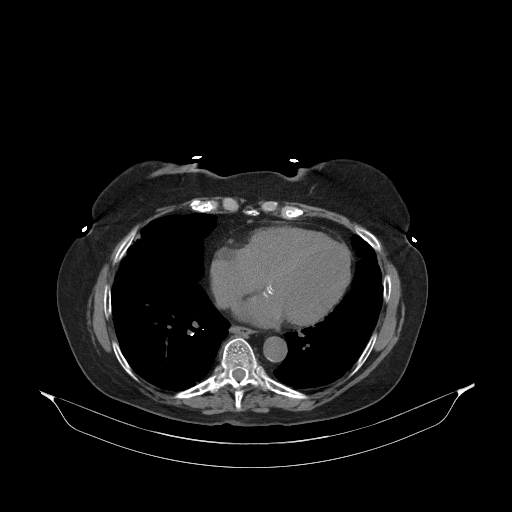

[Series 3: abd/pel cor wo · coronal · 0.76mm/px · 3 of 143 slices shown]
[im 48/143  soft-tissue]
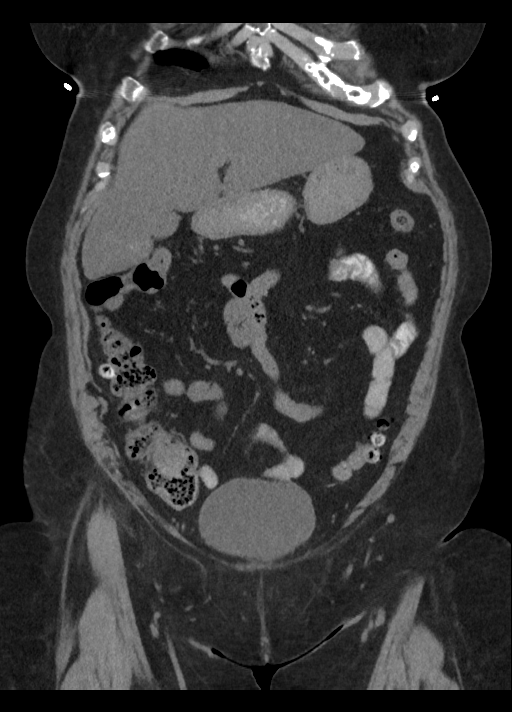
[im 64/143  soft-tissue]
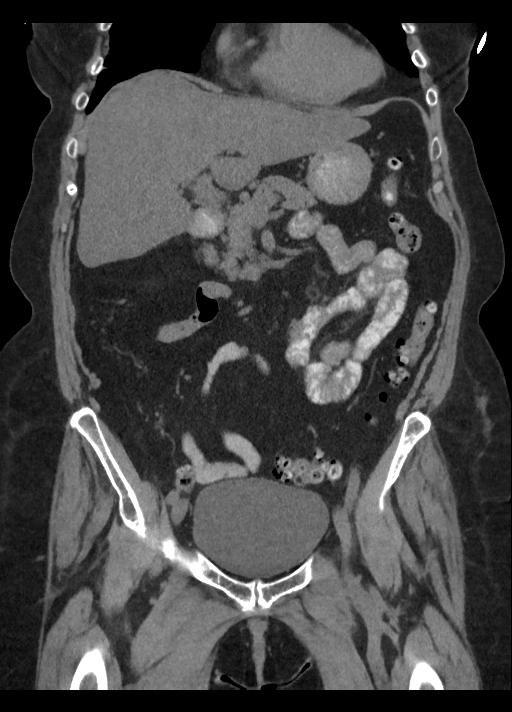
[im 79/143  soft-tissue]
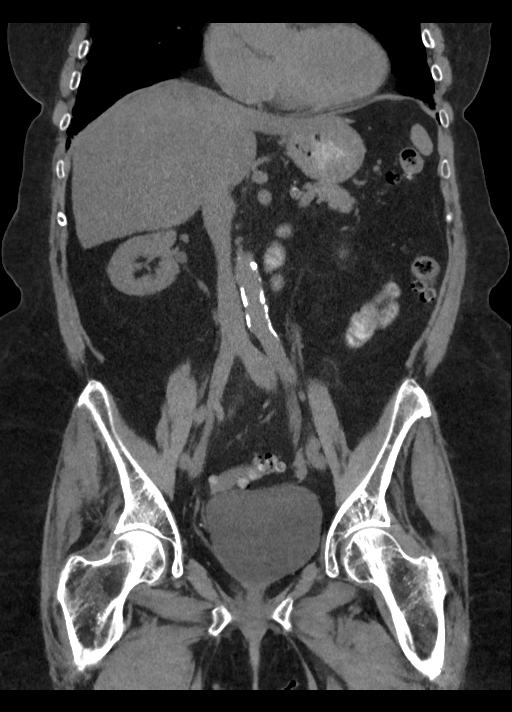

[15 of 46 positions shown; findings below may reference images not displayed]

FINDINGS: Hepatobiliary: Noncontrast images of the liver demonstrate mild fatty 
infiltration. No calcified gallstones or biliary dilatation. 
Pancreas: No ductal dilatation or inflammation. 
Spleen: Normal size spleen without focal abnormality. 
Genitourinary: 1.5 cm exophytic cortical cyst upper pole right kidney. 
Noncontrast images of the left kidney are unremarkable. There are no renal or 
ureteral calculi. No hydronephrosis. Urinary bladder is mildly distended but 
grossly unremarkable. 
Adrenal Glands:No mass. 
Gastrointestinal: The distal esophagus, stomach, duodenum, and small bowel are 
normal in caliber and without focal abnormality. Diverticular disease of the 
colon without evidence for acute diverticulitis. 
Additional Abdominal/Pelvic Findings: No evidence of lymphadenopathy. Normal 
caliber abdominal aorta. No significant free fluid or inflammatory change. Prior 
hysterectomy. 
Lung Bases: Scarring of the lower lungs. There are calcified granulomas in the 
right middle lobe and right lower lobe with calcified hilar lymph nodes. 
Musculoskeletal: Degenerative changes lower lumbar spine facet joints. 
Soft tissues/Abdominal wall: Small fat-containing umbilical hernia.
IMPRESSION: 1. No evidence for bowel obstruction or ascites. 
2. Diverticular disease of the colon without diverticulitis. 
3. Prior hysterectomy. 
4. Mild fatty infiltration liver. 
5. Small fat-containing umbilical hernia. 
6. Scarring of the lower lungs with previous granulomatous infection right lung. 
RADIATION DOSE REDUCTION: All CT scans are performed using radiation dose 
reduction techniques, when applicable.  Technical factors are evaluated and 
adjusted to ensure appropriate moderation of exposure.  Automated dose 
management technology is applied to adjust the radiation doses to minimize 
exposure while achieving diagnostic quality images.

## 2021-01-22 IMAGING — MR MRI BRAIN W/WO CONTRAST
5 of 10 series · 22 of 48 positions shown · IV contrast (gadavist)
Comparison: None.

MRI BRAIN W/WO CONTRAST, 01/22/2021 [DATE]: 
CLINICAL INDICATION: Myelodysplastic syndrome with syncope
TECHNIQUE: Multiplanar, multiecho position MR images of the brain were performed 
without and with Gadavist intravenous gadolinium enhancement.

[Series 401: FLAIR · axial · 5.0mm · 0.65mm/px · z∈[-71,+85]mm · 3 of 27 slices shown]
[im 1/27]
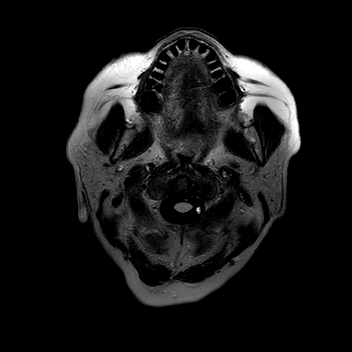
[im 14/27]
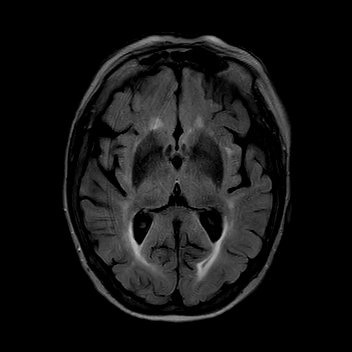
[im 27/27]
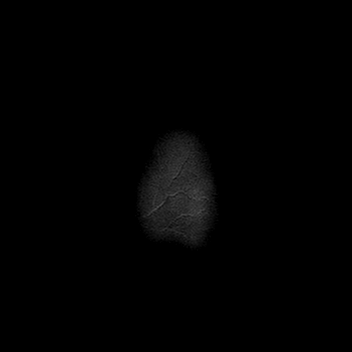

[Series 601: SWI · axial · 3.0mm · 0.53mm/px · z∈[-67,+81]mm · 9 of 100 slices shown (1 of 2)]
[im 1/100]
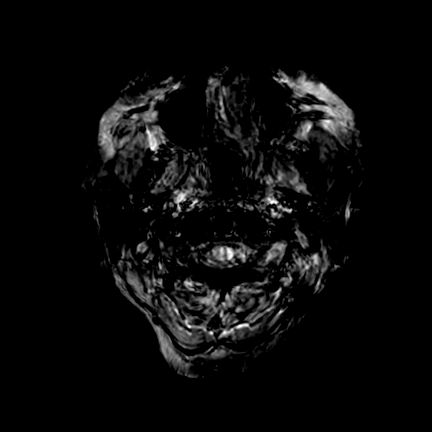
[im 17/100]
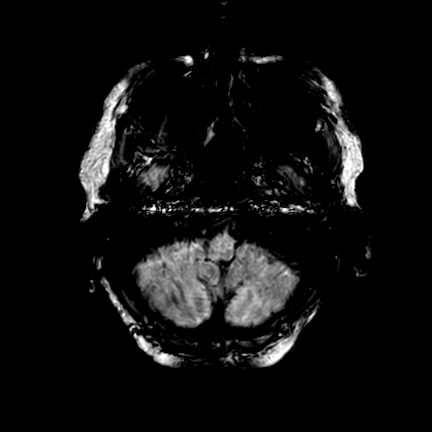
[im 34/100]
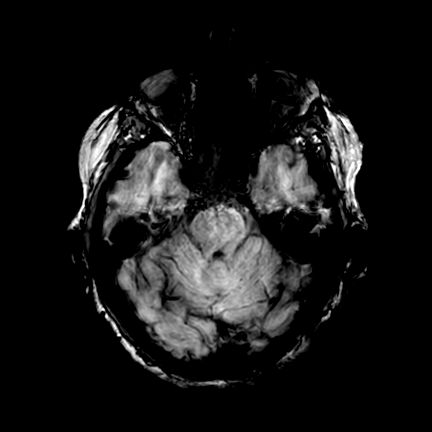
[im 42/100]
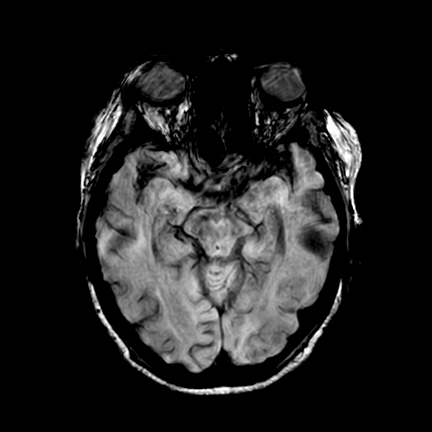
[im 50/100]
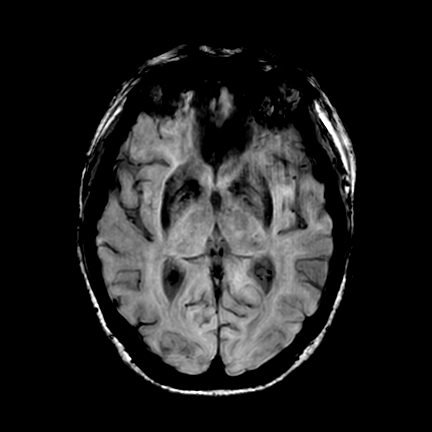
[im 58/100]
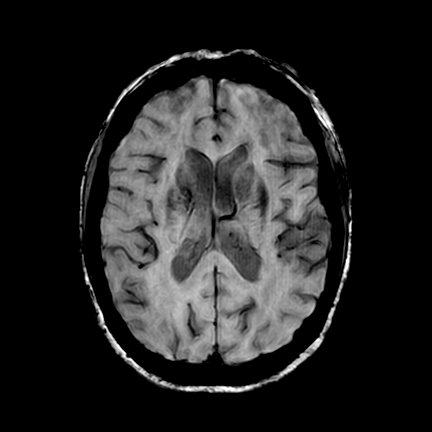
[im 67/100]
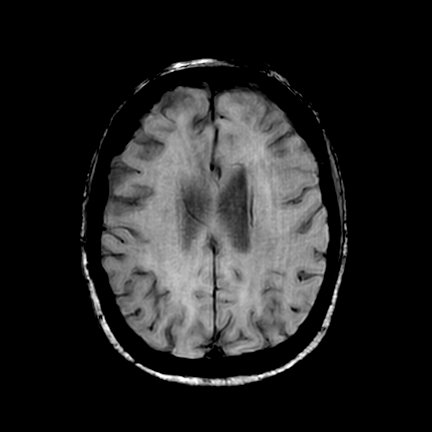
[im 83/100]
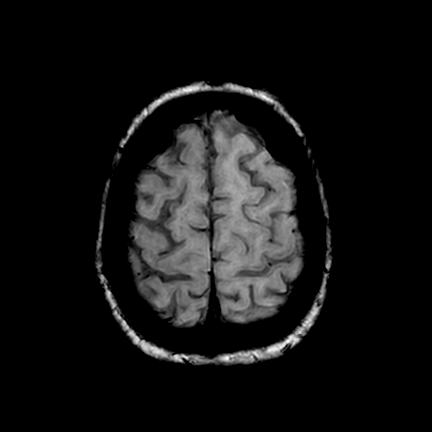
[im 100/100]
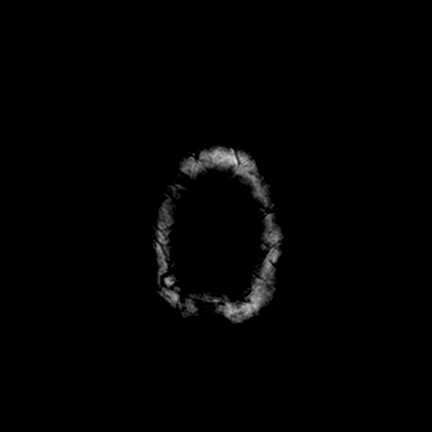

[Series 602: SWI · axial · 10.0mm · 0.53mm/px · z∈[-68,-52]mm · 2 of 78 slices shown (2 of 2)]
[im 1/78]
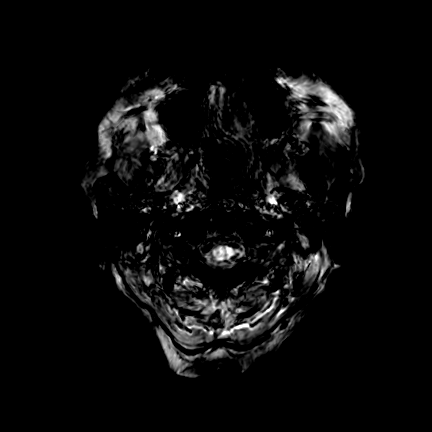
[im 9/78]
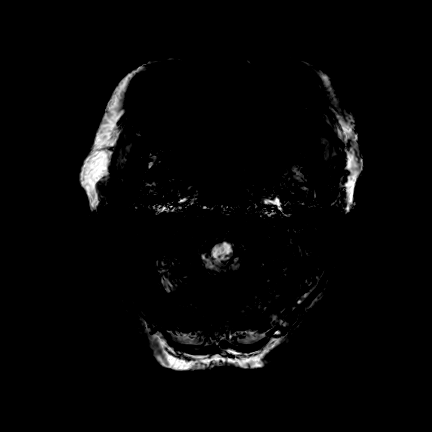

[Series 701: T2 · axial · 5.0mm · 0.41mm/px · z∈[-71,+85]mm · 3 of 27 slices shown]
[im 1/27]
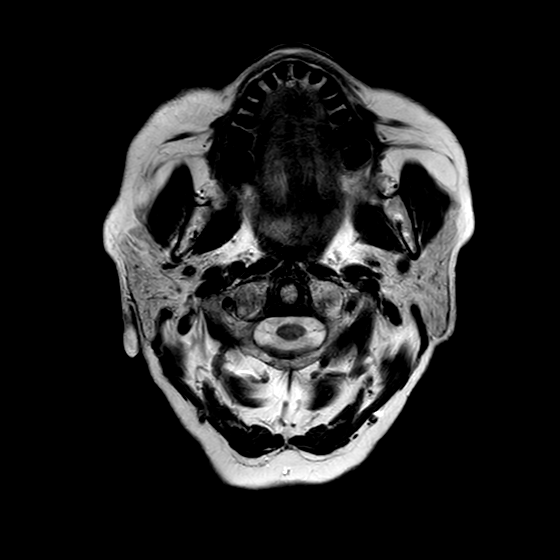
[im 14/27]
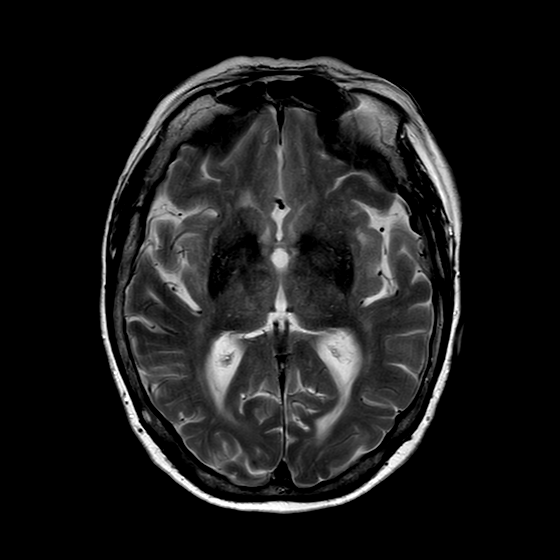
[im 27/27]
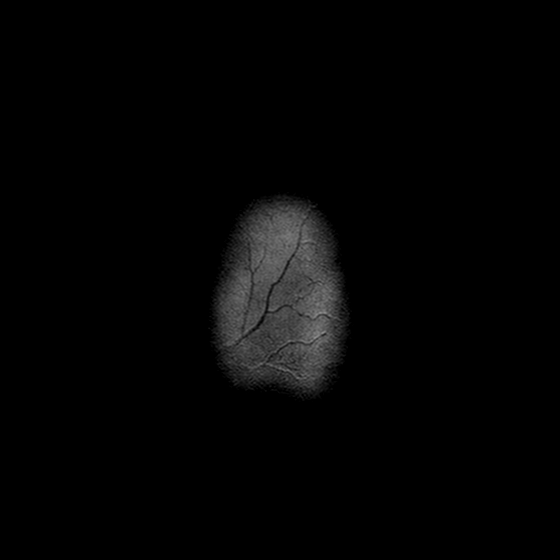

[Series 901: T1 post-contrast · coronal · 4.0mm · 0.38mm/px · 5 of 36 slices shown]
[im 1/36]
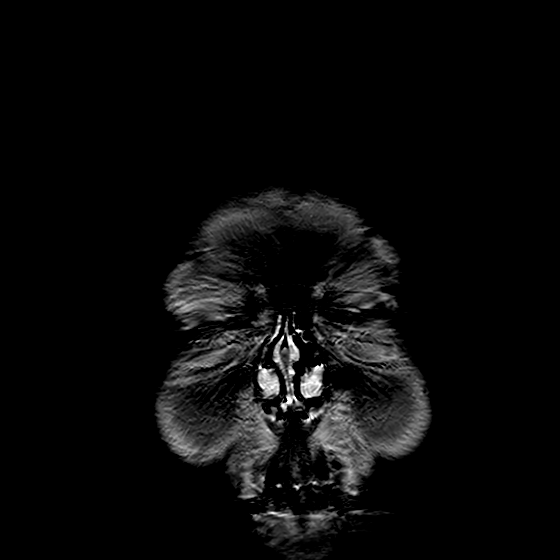
[im 9/36]
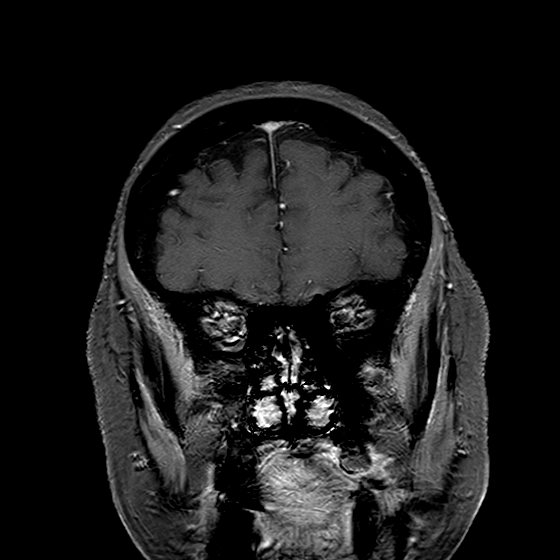
[im 18/36]
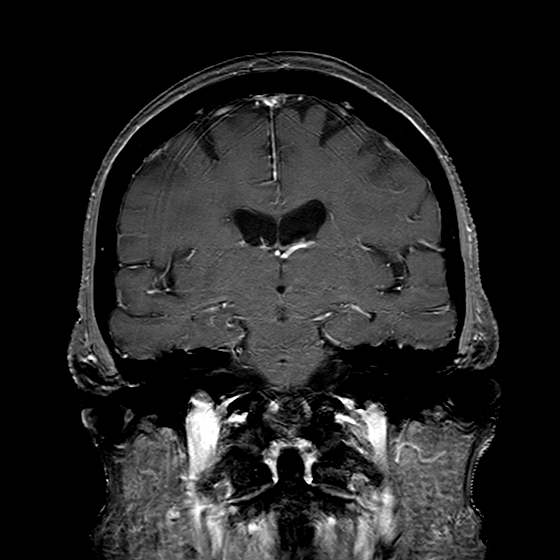
[im 27/36]
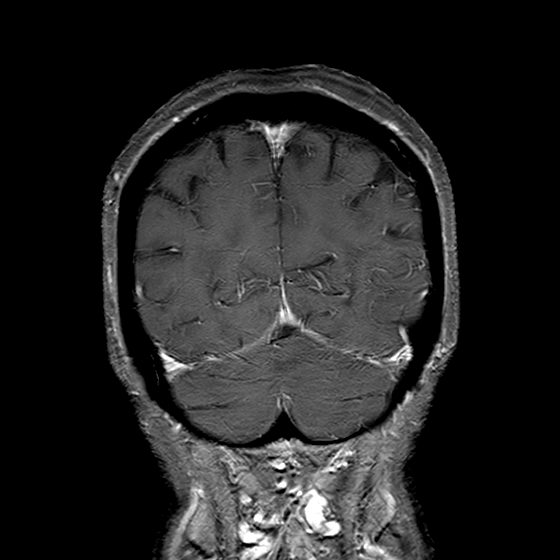
[im 36/36]
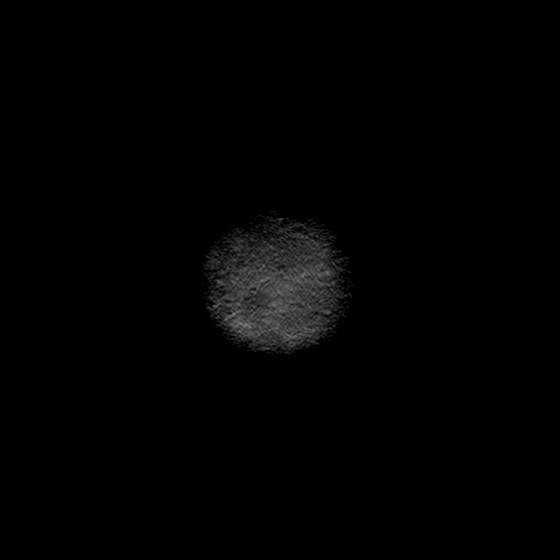

[22 of 48 positions shown; findings below may reference images not displayed]

FINDINGS: EXTRAAXIAL SPACE: Ventricles appear age appropriate and maintain midline 
position. No collection, mass lesion, significant signal abnormality or abnormal 
enhancement is present. Signal voids of basilar and internal carotid arteries 
maintained. 
CEREBRUM: There is evidence of generalized cerebral atrophy. Prominent patchy 
areas of increased T2-weighted signal intensity are observed in the subcortical 
and periventricular white matter tracts of the cerebral hemispheres in a manner 
most compatible with ischemic demyelination secondary to microangiopathy. No 
restricted diffusion identified. No abnormal masses or fluid collections are 
observed. There is a small focus of encephalomalacia in the medial right 
thalamus adjacent to the right lateral ventricle that is most likely secondary 
to a previous lacunar infarct or hemorrhage. No evidence of an acute 
intraparenchymal hemorrhages exhibited. 
CEREBELLUM: Cerebellar hemispheres and vermis are well formed without mass 
lesion, signal abnormality, or abnormal enhancement. Basal cisterns are 
maintained. 
BRAINSTEM: Midbrain, pons, and medulla are well formed without mass, signal 
abnormality, or abnormal enhancement. 
SKULL/EXTRACRANIAL STRUCTURES: There is evidence for a mild left sphenoid sinus 
air-fluid level which may indicate sinusitis. Pituitary fossa and orbits without 
definite mass.  Calvarium, visualized skull base, and remainder of visualized 
upper neck appear unremarkable.
IMPRESSION: There is demonstration of generalized cerebral atrophy and changes most 
consistent with prominent ischemic demyelination. 
There is an air-fluid level in the left sphenoid sinus which can indicate acute 
sinusitis.

## 2021-10-27 ENCOUNTER — Observation Stay: Payer: No Typology Code available for payment source

## 2021-10-27 ENCOUNTER — Emergency Department: Payer: No Typology Code available for payment source

## 2021-10-27 ENCOUNTER — Inpatient Hospital Stay
Admission: EM | Admit: 2021-10-27 | Discharge: 2021-11-02 | DRG: 189 | Disposition: A | Discharge status: Skilled Nursing Facility | Payer: No Typology Code available for payment source | Attending: Internal Medicine | Admitting: Internal Medicine

## 2021-10-27 DIAGNOSIS — M316 Other giant cell arteritis: Secondary | ICD-10-CM | POA: Diagnosis present

## 2021-10-27 DIAGNOSIS — R000 Tachycardia, unspecified: Secondary | ICD-10-CM | POA: Diagnosis present

## 2021-10-27 DIAGNOSIS — Z79890 Hormone replacement therapy (postmenopausal): Secondary | ICD-10-CM

## 2021-10-27 DIAGNOSIS — R079 Chest pain, unspecified: Secondary | ICD-10-CM

## 2021-10-27 DIAGNOSIS — D75839 Thrombocytosis, unspecified: Secondary | ICD-10-CM | POA: Diagnosis present

## 2021-10-27 DIAGNOSIS — Z8781 Personal history of (healed) traumatic fracture: Secondary | ICD-10-CM

## 2021-10-27 DIAGNOSIS — Z9481 Bone marrow transplant status: Secondary | ICD-10-CM

## 2021-10-27 DIAGNOSIS — R8290 Unspecified abnormal findings in urine: Secondary | ICD-10-CM | POA: Diagnosis present

## 2021-10-27 DIAGNOSIS — R8281 Pyuria: Secondary | ICD-10-CM

## 2021-10-27 DIAGNOSIS — Z7952 Long term (current) use of systemic steroids: Secondary | ICD-10-CM

## 2021-10-27 DIAGNOSIS — A0472 Enterocolitis due to Clostridium difficile, not specified as recurrent: Secondary | ICD-10-CM

## 2021-10-27 DIAGNOSIS — G4700 Insomnia, unspecified: Secondary | ICD-10-CM | POA: Diagnosis present

## 2021-10-27 DIAGNOSIS — N390 Urinary tract infection, site not specified: Secondary | ICD-10-CM | POA: Diagnosis present

## 2021-10-27 DIAGNOSIS — Z751 Person awaiting admission to adequate facility elsewhere: Secondary | ICD-10-CM

## 2021-10-27 DIAGNOSIS — I129 Hypertensive chronic kidney disease with stage 1 through stage 4 chronic kidney disease, or unspecified chronic kidney disease: Secondary | ICD-10-CM | POA: Diagnosis present

## 2021-10-27 DIAGNOSIS — I080 Rheumatic disorders of both mitral and aortic valves: Secondary | ICD-10-CM | POA: Diagnosis present

## 2021-10-27 DIAGNOSIS — N1832 Chronic kidney disease, stage 3b: Secondary | ICD-10-CM | POA: Diagnosis present

## 2021-10-27 DIAGNOSIS — Z8673 Personal history of transient ischemic attack (TIA), and cerebral infarction without residual deficits: Secondary | ICD-10-CM

## 2021-10-27 DIAGNOSIS — A0471 Enterocolitis due to Clostridium difficile, recurrent: Secondary | ICD-10-CM | POA: Diagnosis present

## 2021-10-27 DIAGNOSIS — A499 Bacterial infection, unspecified: Secondary | ICD-10-CM | POA: Diagnosis present

## 2021-10-27 DIAGNOSIS — Z87440 Personal history of urinary (tract) infections: Secondary | ICD-10-CM

## 2021-10-27 DIAGNOSIS — Z20822 Contact with and (suspected) exposure to covid-19: Secondary | ICD-10-CM | POA: Diagnosis present

## 2021-10-27 DIAGNOSIS — T380X5A Adverse effect of glucocorticoids and synthetic analogues, initial encounter: Secondary | ICD-10-CM | POA: Diagnosis present

## 2021-10-27 DIAGNOSIS — E871 Hypo-osmolality and hyponatremia: Secondary | ICD-10-CM | POA: Diagnosis not present

## 2021-10-27 DIAGNOSIS — K219 Gastro-esophageal reflux disease without esophagitis: Secondary | ICD-10-CM | POA: Diagnosis present

## 2021-10-27 DIAGNOSIS — Z79899 Other long term (current) drug therapy: Secondary | ICD-10-CM

## 2021-10-27 DIAGNOSIS — I951 Orthostatic hypotension: Secondary | ICD-10-CM | POA: Diagnosis present

## 2021-10-27 DIAGNOSIS — E039 Hypothyroidism, unspecified: Secondary | ICD-10-CM | POA: Diagnosis present

## 2021-10-27 DIAGNOSIS — R339 Retention of urine, unspecified: Secondary | ICD-10-CM | POA: Diagnosis present

## 2021-10-27 DIAGNOSIS — J90 Pleural effusion, not elsewhere classified: Secondary | ICD-10-CM | POA: Diagnosis present

## 2021-10-27 DIAGNOSIS — Z880 Allergy status to penicillin: Secondary | ICD-10-CM

## 2021-10-27 DIAGNOSIS — E8809 Other disorders of plasma-protein metabolism, not elsewhere classified: Secondary | ICD-10-CM | POA: Diagnosis present

## 2021-10-27 DIAGNOSIS — M797 Fibromyalgia: Secondary | ICD-10-CM | POA: Diagnosis present

## 2021-10-27 DIAGNOSIS — J9601 Acute respiratory failure with hypoxia: Principal | ICD-10-CM | POA: Diagnosis present

## 2021-10-27 DIAGNOSIS — R0789 Other chest pain: Secondary | ICD-10-CM

## 2021-10-27 DIAGNOSIS — G8929 Other chronic pain: Secondary | ICD-10-CM | POA: Diagnosis present

## 2021-10-27 DIAGNOSIS — I2510 Atherosclerotic heart disease of native coronary artery without angina pectoris: Secondary | ICD-10-CM | POA: Diagnosis present

## 2021-10-27 DIAGNOSIS — F0154 Vascular dementia, unspecified severity, with anxiety: Secondary | ICD-10-CM | POA: Diagnosis present

## 2021-10-27 DIAGNOSIS — M353 Polymyalgia rheumatica: Secondary | ICD-10-CM | POA: Diagnosis present

## 2021-10-27 DIAGNOSIS — E785 Hyperlipidemia, unspecified: Secondary | ICD-10-CM | POA: Diagnosis present

## 2021-10-27 DIAGNOSIS — D469 Myelodysplastic syndrome, unspecified: Secondary | ICD-10-CM | POA: Diagnosis present

## 2021-10-27 DIAGNOSIS — J8410 Pulmonary fibrosis, unspecified: Secondary | ICD-10-CM | POA: Diagnosis present

## 2021-10-27 DIAGNOSIS — M8088XA Other osteoporosis with current pathological fracture, vertebra(e), initial encounter for fracture: Secondary | ICD-10-CM | POA: Diagnosis present

## 2021-10-27 DIAGNOSIS — J9811 Atelectasis: Secondary | ICD-10-CM | POA: Diagnosis present

## 2021-10-27 HISTORY — DX: Enterocolitis due to Clostridium difficile, not specified as recurrent: A04.72

## 2021-10-27 HISTORY — DX: Hyperlipidemia, unspecified: E78.5

## 2021-10-27 HISTORY — DX: Anxiety disorder, unspecified: F41.9

## 2021-10-27 HISTORY — DX: Essential (primary) hypertension: I10

## 2021-10-27 LAB — URINALYSIS REFLEX TO MICROSCOPIC EXAM - REFLEX TO CULTURE
Bilirubin, UA: NEGATIVE
Glucose, UA: NEGATIVE
Ketones UA: NEGATIVE
Nitrite, UA: NEGATIVE
Specific Gravity UA: 1.016 (ref 1.001–1.035)
Urine pH: 7 (ref 5.0–8.0)
Urobilinogen, UA: NORMAL mg/dL (ref 0.2–2.0)

## 2021-10-27 LAB — COMPREHENSIVE METABOLIC PANEL
ALT: 28 U/L (ref 0–55)
AST (SGOT): 24 U/L (ref 5–41)
Albumin/Globulin Ratio: 0.7 — ABNORMAL LOW (ref 0.9–2.2)
Albumin: 2.7 g/dL — ABNORMAL LOW (ref 3.5–5.0)
Alkaline Phosphatase: 83 U/L (ref 37–117)
Anion Gap: 14 (ref 5.0–15.0)
BUN: 15 mg/dL (ref 7.0–21.0)
Bilirubin, Total: 0.4 mg/dL (ref 0.2–1.2)
CO2: 25 mEq/L (ref 17–29)
Calcium: 9.1 mg/dL (ref 7.9–10.2)
Chloride: 98 mEq/L — ABNORMAL LOW (ref 99–111)
Creatinine: 0.7 mg/dL (ref 0.4–1.0)
Globulin: 3.7 g/dL — ABNORMAL HIGH (ref 2.0–3.6)
Glucose: 171 mg/dL — ABNORMAL HIGH (ref 70–100)
Potassium: 4.1 mEq/L (ref 3.5–5.3)
Protein, Total: 6.4 g/dL (ref 6.0–8.3)
Sodium: 137 mEq/L (ref 135–145)

## 2021-10-27 LAB — CBC AND DIFFERENTIAL
Absolute NRBC: 0 10*3/uL (ref 0.00–0.00)
Basophils Absolute Automated: 0.08 10*3/uL (ref 0.00–0.08)
Basophils Automated: 0.5 %
Eosinophils Absolute Automated: 0.5 10*3/uL — ABNORMAL HIGH (ref 0.00–0.44)
Eosinophils Automated: 3 %
Hematocrit: 34.5 % — ABNORMAL LOW (ref 34.7–43.7)
Hgb: 10.7 g/dL — ABNORMAL LOW (ref 11.4–14.8)
Immature Granulocytes Absolute: 0.05 10*3/uL (ref 0.00–0.07)
Immature Granulocytes: 0.3 %
Lymphocytes Absolute Automated: 2.06 10*3/uL (ref 0.42–3.22)
Lymphocytes Automated: 12.5 %
MCH: 28 pg (ref 25.1–33.5)
MCHC: 31 g/dL — ABNORMAL LOW (ref 31.5–35.8)
MCV: 90.3 fL (ref 78.0–96.0)
MPV: 10.5 fL (ref 8.9–12.5)
Monocytes Absolute Automated: 1.28 10*3/uL — ABNORMAL HIGH (ref 0.21–0.85)
Monocytes: 7.8 %
Neutrophils Absolute: 12.5 10*3/uL — ABNORMAL HIGH (ref 1.10–6.33)
Neutrophils: 75.9 %
Nucleated RBC: 0 /100 WBC (ref 0.0–0.0)
Platelets: 525 10*3/uL — ABNORMAL HIGH (ref 142–346)
RBC: 3.82 10*6/uL — ABNORMAL LOW (ref 3.90–5.10)
RDW: 15 % (ref 11–15)
WBC: 16.47 10*3/uL — ABNORMAL HIGH (ref 3.10–9.50)

## 2021-10-27 LAB — ECG 12-LEAD
Atrial Rate: 111 {beats}/min
P Axis: 26 degrees
P-R Interval: 168 ms
Q-T Interval: 314 ms
QRS Duration: 70 ms
QTC Calculation (Bezet): 427 ms
R Axis: -23 degrees
T Axis: 75 degrees
Ventricular Rate: 111 {beats}/min

## 2021-10-27 LAB — GFR: EGFR: >60

## 2021-10-27 LAB — TROPONIN I: Troponin I: <0.01 ng/mL (ref 0.00–0.05)

## 2021-10-27 MED ORDER — ACETAMINOPHEN 500 MG PO TABS
1000.0000 mg | ORAL_TABLET | Freq: Three times a day (TID) | ORAL | Status: DC
Start: 2021-10-27 — End: 2021-11-02
  Administered 2021-10-27 – 2021-11-02 (×17): 1000 mg via ORAL
  Filled 2021-10-27 (×18): qty 2

## 2021-10-27 MED ORDER — FOLIC ACID 1 MG PO TABS
1.0000 mg | ORAL_TABLET | Freq: Every day | ORAL | Status: DC
Start: 2021-10-27 — End: 2021-11-02
  Administered 2021-10-28 – 2021-11-02 (×6): 1 mg via ORAL
  Filled 2021-10-27 (×6): qty 1

## 2021-10-27 MED ORDER — PREDNISONE 5 MG PO TABS
5.0000 mg | ORAL_TABLET | Freq: Every day | ORAL | Status: DC
Start: 2021-10-27 — End: 2021-10-29
  Administered 2021-10-28 – 2021-10-29 (×2): 5 mg via ORAL
  Filled 2021-10-27 (×2): qty 1

## 2021-10-27 MED ORDER — LEVOFLOXACIN IN D5W 500 MG/100ML IV SOLN
500.0000 mg | INTRAVENOUS | Status: DC
Start: 2021-10-28 — End: 2021-10-29
  Administered 2021-10-28 – 2021-10-29 (×2): 500 mg via INTRAVENOUS
  Filled 2021-10-27 (×2): qty 100

## 2021-10-27 MED ORDER — LEVOFLOXACIN 750 MG PO TABS
750.0000 mg | ORAL_TABLET | Freq: Once | ORAL | Status: DC
Start: 2021-10-27 — End: 2021-10-27

## 2021-10-27 MED ORDER — LEVOTHYROXINE SODIUM 125 MCG PO TABS
125.0000 ug | ORAL_TABLET | Freq: Every day | ORAL | Status: DC
Start: 2021-10-28 — End: 2021-11-02
  Administered 2021-10-28 – 2021-11-02 (×6): 125 ug via ORAL
  Filled 2021-10-27 (×6): qty 1

## 2021-10-27 MED ORDER — ENOXAPARIN SODIUM 40 MG/0.4ML IJ SOSY
40.0000 mg | PREFILLED_SYRINGE | Freq: Every day | INTRAMUSCULAR | Status: DC
Start: 2021-10-27 — End: 2021-11-02
  Administered 2021-10-27 – 2021-11-02 (×7): 40 mg via SUBCUTANEOUS
  Filled 2021-10-27 (×7): qty 0.4

## 2021-10-27 MED ORDER — PRAVASTATIN SODIUM 20 MG PO TABS
20.0000 mg | ORAL_TABLET | Freq: Every day | ORAL | Status: DC
Start: 2021-10-27 — End: 2021-11-02
  Administered 2021-10-28 – 2021-11-02 (×6): 20 mg via ORAL
  Filled 2021-10-27 (×7): qty 1

## 2021-10-27 MED ORDER — FENTANYL CITRATE (PF) 50 MCG/ML IJ SOLN (WRAP)
50.0000 ug | Freq: Once | INTRAMUSCULAR | Status: AC
Start: 2021-10-27 — End: 2021-10-27
  Administered 2021-10-27: 50 ug via INTRAVENOUS
  Filled 2021-10-27: qty 2

## 2021-10-27 MED ORDER — TRAMADOL HCL 50 MG PO TABS
50.0000 mg | ORAL_TABLET | Freq: Four times a day (QID) | ORAL | Status: DC | PRN
Start: 2021-10-27 — End: 2021-10-28
  Administered 2021-10-27 – 2021-10-28 (×2): 50 mg via ORAL
  Filled 2021-10-27 (×2): qty 1

## 2021-10-27 MED ORDER — POTASSIUM & SODIUM PHOSPHATES 280-160-250 MG PO PACK
2.0000 | PACK | ORAL | Status: DC | PRN
Start: 2021-10-27 — End: 2021-11-02

## 2021-10-27 MED ORDER — HYDROMORPHONE HCL 0.5 MG/0.5 ML IJ SOLN
0.2000 mg | INTRAMUSCULAR | Status: DC | PRN
Start: 2021-10-27 — End: 2021-10-28
  Administered 2021-10-28: 0.2 mg via INTRAVENOUS
  Filled 2021-10-27: qty 1

## 2021-10-27 MED ORDER — LEVOFLOXACIN IN D5W 750 MG/150ML IV SOLN
750.0000 mg | Freq: Once | INTRAVENOUS | Status: AC
Start: 2021-10-27 — End: 2021-10-27
  Administered 2021-10-27: 750 mg via INTRAVENOUS
  Filled 2021-10-27: qty 150

## 2021-10-27 MED ORDER — MELATONIN 3 MG PO TABS
3.0000 mg | ORAL_TABLET | Freq: Every evening | ORAL | Status: DC | PRN
Start: 2021-10-27 — End: 2021-11-02

## 2021-10-27 MED ORDER — AMLODIPINE BESYLATE 5 MG PO TABS
10.0000 mg | ORAL_TABLET | Freq: Every day | ORAL | Status: DC
Start: 2021-10-27 — End: 2021-10-28

## 2021-10-27 MED ORDER — PANTOPRAZOLE SODIUM 40 MG PO TBEC
40.0000 mg | DELAYED_RELEASE_TABLET | Freq: Every morning | ORAL | Status: DC
Start: 2021-10-27 — End: 2021-11-02
  Administered 2021-10-28 – 2021-11-02 (×6): 40 mg via ORAL
  Filled 2021-10-27 (×7): qty 1

## 2021-10-27 MED ORDER — POTASSIUM CHLORIDE CRYS ER 20 MEQ PO TBCR
0.0000 meq | EXTENDED_RELEASE_TABLET | ORAL | Status: DC | PRN
Start: 2021-10-27 — End: 2021-11-02
  Administered 2021-10-28: 20 meq via ORAL
  Filled 2021-10-27: qty 1

## 2021-10-27 MED ORDER — ONDANSETRON HCL 4 MG/2ML IJ SOLN
4.0000 mg | Freq: Three times a day (TID) | INTRAMUSCULAR | Status: DC | PRN
Start: 2021-10-27 — End: 2021-10-28
  Administered 2021-10-27 – 2021-10-28 (×2): 4 mg via INTRAVENOUS
  Filled 2021-10-27 (×2): qty 2

## 2021-10-27 MED ORDER — TRAZODONE HCL 50 MG PO TABS
25.0000 mg | ORAL_TABLET | Freq: Every evening | ORAL | Status: DC
Start: 2021-10-27 — End: 2021-11-02
  Administered 2021-10-27 – 2021-11-01 (×6): 25 mg via ORAL
  Filled 2021-10-27 (×7): qty 1

## 2021-10-27 MED ORDER — MAGNESIUM SULFATE IN D5W 1-5 GM/100ML-% IV SOLN
1.0000 g | INTRAVENOUS | Status: DC | PRN
Start: 2021-10-27 — End: 2021-11-02

## 2021-10-27 MED ORDER — DEXTROSE 5% IV BOLUS
250.0000 mL | INTRAVENOUS | Status: DC | PRN
Start: 2021-10-27 — End: 2021-11-02

## 2021-10-27 MED ORDER — LEFLUNOMIDE 20 MG PO TABS
20.0000 mg | ORAL_TABLET | Freq: Every day | ORAL | Status: DC
Start: 2021-10-27 — End: 2021-11-02
  Administered 2021-10-28 – 2021-11-02 (×6): 20 mg via ORAL
  Filled 2021-10-27 (×9): qty 1

## 2021-10-27 MED ORDER — OXYCODONE HCL 5 MG PO TABS
2.5000 mg | ORAL_TABLET | ORAL | Status: DC | PRN
Start: 2021-10-27 — End: 2021-11-02
  Administered 2021-11-01: 2.5 mg via ORAL
  Filled 2021-10-27: qty 1

## 2021-10-27 MED ORDER — DIAZEPAM 2 MG PO TABS
2.0000 mg | ORAL_TABLET | Freq: Four times a day (QID) | ORAL | Status: DC | PRN
Start: 2021-10-27 — End: 2021-10-27

## 2021-10-27 MED ORDER — DIAZEPAM 2 MG PO TABS
2.0000 mg | ORAL_TABLET | ORAL | Status: DC | PRN
Start: 2021-10-27 — End: 2021-10-30
  Administered 2021-10-29 (×2): 2 mg via ORAL
  Filled 2021-10-27 (×2): qty 1

## 2021-10-27 MED ORDER — DEXTROSE 50 % IV SOLN
25.0000 g | INTRAVENOUS | Status: DC | PRN
Start: 2021-10-27 — End: 2021-11-02

## 2021-10-27 MED ORDER — MORPHINE SULFATE 4 MG/ML IJ/IV SOLN (WRAP)
4.0000 mg | Freq: Once | Status: AC
Start: 2021-10-27 — End: 2021-10-27
  Administered 2021-10-27: 4 mg via INTRAVENOUS
  Filled 2021-10-27: qty 1

## 2021-10-27 MED ORDER — GLUCAGON 1 MG IJ SOLR (WRAP)
1.0000 mg | INTRAMUSCULAR | Status: DC | PRN
Start: 2021-10-27 — End: 2021-11-02

## 2021-10-27 MED ORDER — PROPRANOLOL HCL 10 MG PO TABS
10.0000 mg | ORAL_TABLET | Freq: Every day | ORAL | Status: DC
Start: 2021-10-27 — End: 2021-11-02
  Administered 2021-10-28 – 2021-11-02 (×6): 10 mg via ORAL
  Filled 2021-10-27 (×9): qty 1

## 2021-10-27 MED ORDER — METHOCARBAMOL 500 MG PO TABS
500.0000 mg | ORAL_TABLET | Freq: Three times a day (TID) | ORAL | Status: DC | PRN
Start: 2021-10-27 — End: 2021-10-28
  Administered 2021-10-28: 500 mg via ORAL
  Filled 2021-10-27: qty 1

## 2021-10-27 MED ORDER — LIDOCAINE 5 % EX PTCH
2.0000 | MEDICATED_PATCH | CUTANEOUS | Status: DC
Start: 2021-10-27 — End: 2021-11-02
  Administered 2021-10-27 – 2021-11-01 (×6): 2 via TRANSDERMAL
  Filled 2021-10-27 (×6): qty 2

## 2021-10-27 MED ORDER — POTASSIUM CHLORIDE 10 MEQ/100ML IV SOLN
10.0000 meq | INTRAVENOUS | Status: DC | PRN
Start: 2021-10-27 — End: 2021-11-02

## 2021-10-27 MED ORDER — FIDAXOMICIN 200 MG PO TABS
200.0000 mg | ORAL_TABLET | Freq: Two times a day (BID) | ORAL | Status: AC
Start: 2021-10-27 — End: 2021-10-28
  Administered 2021-10-27 – 2021-10-28 (×3): 200 mg via ORAL
  Filled 2021-10-27 (×3): qty 1

## 2021-10-27 MED ORDER — IOHEXOL 350 MG/ML IV SOLN
100.0000 mL | Freq: Once | INTRAVENOUS | Status: AC | PRN
Start: 2021-10-27 — End: 2021-10-27
  Administered 2021-10-27: 100 mL via INTRAVENOUS

## 2021-10-27 MED ORDER — NALOXONE HCL 0.4 MG/ML IJ SOLN (WRAP)
0.2000 mg | INTRAMUSCULAR | Status: DC | PRN
Start: 2021-10-27 — End: 2021-11-02

## 2021-10-27 MED ORDER — OXYCODONE HCL 5 MG PO TABS
5.0000 mg | ORAL_TABLET | ORAL | Status: DC | PRN
Start: 2021-10-27 — End: 2021-11-02
  Administered 2021-10-28 – 2021-11-02 (×7): 5 mg via ORAL
  Filled 2021-10-27 (×7): qty 1

## 2021-10-27 MED ORDER — DEXTROSE 10 % IV BOLUS
25.0000 g | INTRAVENOUS | Status: DC | PRN
Start: 2021-10-27 — End: 2021-11-02

## 2021-10-27 NOTE — ED Triage Notes (Signed)
Pt here with c/c of chest pain under her breast. Per medic, pain started 30 min prior to arrival. Pt has hx of anxiety and states this has happened before and may be related to UTI. Pt also having shortness of breath along with chest pain. Pt states she feels her chest tight. Rates pain 10/10. Pt came on 5L NC and foley catheter.

## 2021-10-27 NOTE — Plan of Care (Signed)
Cross cover    Notified of pt c/o nausea  Orders placed for Zofran PRN

## 2021-10-27 NOTE — ED Notes (Addendum)
[facility] EMERGENCY DEPT  ED NURSING NOTE FOR THE RECEIVING INPATIENT NURSE   ED NURSE [place] [id number]   ED CHARGE RN Marisue Ivan   ADMISSION INFORMATION   Craig Wisnewski Dolin is a 87 y.o. female admitted with an ED diagnosis of:    1. Bacterial urinary infection         Isolation: None   Allergies: Penicillins   Holding Orders confirmed? Yes   Belongings Documented? Yes   Home medications sent to pharmacy confirmed? N/A   NURSING CARE   Patient Comes From:   Mental Status: Acute Care Facility  alert and oriented   ADL: Independent with all ADLs   Ambulation: Walks with walker or in wheelchair    Pertinent Information  and Safety Concerns:     [place] Violence Risk Level: Low Patient being transported from [place] to [place] for new nursing facility closer to family when she started to hae chest pain. Patient is Aox4, endorses chest pain 9/10 after pain meds. Patient has hx of anxiety and having chest pain while being in ambulance      CT / NIH   CT Head ordered on this patient?  No   NIH/Dysphagia assessment done prior to admission? N/A   VITAL SIGNS (at the time of this note)      Vitals:    10/27/21 1445   BP: 167/80   Pulse: (!) 104   Resp:    Temp:    SpO2: 100%

## 2021-10-27 NOTE — Progress Notes (Signed)
Patient received from ER via stretcher  Patient is alert, forgetful, oriented to self, to situation and place only  Patient's daughter and POA is [name] per ER notes, currently attempting to obtain phone number from patient    Patient has chronic foley in place present on admission, unknown when it was last changed. Urine positive for UTI, levofloxacin administered in ER. Urine culture already sent.    Patient had one episode of nausea and vomiting. Endorses continued nausea and asking for Zofran. Reached out to MD for orders.    Patient also experienced small nosebleed which resolved quickly.    Patient denies having shortness of breath and currently sats 95% on RA but uses 2L NC for "comfort". Patient appears anxious.    Patient refusing some medications, mentioned the EMTs already gave her medications today.  Will administer PM doses and PRNs.    Skin intact with redness on sacrum and upper back, blanchable on back, non-blanchable on sacrum.

## 2021-10-27 NOTE — H&P (Signed)
ADMISSION HISTORY AND PHYSICAL EXAM    Date Time: 10/27/21 3:24 PM  Patient Name: Jessica Mcbride  Attending Physician: Jessica Becton, MD  Primary Care Physician: No primary care provider on file.    CC: Chest pain      Assessment/Plan:   87 year old female with complicated and limited PMH of PMR, temporal arteritis, myelodysplastic syndrome s/p BMT 1999, chronic anemia, HTN, CAD, Hx of CVA, hypothyroidism, HLD, diverticulosis, CKD 3B, chronic chest pain, orthostatic hypotension, urinary retention, with recent C. Diff infection failed oral vancomycin, recurrent UTIs, and vertebral fractures, who presents to the hospital with chest spasm pains, suspect non-cardiac in etiology and shortness of breath, currently undergoing further work up.    #Chest pain  - Acute on chronic  - Suspect muscle spasm in etiology  - Was previously on Robaxin (will continue)  ---States she has been receiving Diazepam as needed for muscle spasms as well (noted in her bag of medications)  Pain plan  Continue Robaxin  Start Diazepam 2mg  q4H PRN for muscle spasms not improving with Robaxin  Start Tylenol 1g q8H standing  Start Tramadol for mild pain, Oxycodone 2.5/5 for mod/severe pain, Dilaudid 0.2 mg IV for severe not tolerating PO    F/u BNP, Echo given history of reported CHF (denies) with Aortic and MV regurg (says it self resolved)    #Dyspnea  - Acute  - Suspect pain related, CTA chest negative for PE  - Small effusions may cause some changes in chest wall dynamics causing dyspnea  - Daughter reporting previous chest pain SOB when she had another spontaneous vertebral fracture  - Not hypoxic, on 2L for comfort and reports SOB  Check CRP, ESR, Procalcitonin  F/u CT thoracic and lumbar spine for new fracture  T/c Pulm consult for calcified granuloma and reported history of lung cancer from calcified granuloma in sister  Wean O2 as tolerated SpO2>90%    #History of Vertebral fractures  - Chronic, likely 2/2 osteoporosis  exacerbated by chronic steroids  F/u CT as above  Trial Lidocaine patch along thoracic and vertebral spine  Pain plan as above  PT/OT eval ordered    #Calcified granuloma  -Noted  - Plan as above    #Pyuria  #Urinary retention  - Chronic?  - With foley, reports getting chest pains from UTIs as well (though daughter tells me patient may have vascular dementia so is not always reliable historian)  F/u Ucx  Continue Levofloxacin (5 day course)  Appreciate ID guidance    #C diff  - Acute, reportedly failed Vancomycin (last dose on 11/9 but diarrhea tested positive?)  - Continue fidaxomicin for now, has only 3 pills remaining in bag  - ID consulted, appreciate recs    #HTN  #Orthostasis  - Chronic  Continue Amlodipine and propranolol  SBP goal <116mmHg  Start Compression stockings  Check Orthostatics qshift    #PMR  - Chronic, stable  - Continue Prednisone 5mg   - Check CRP/ESR, doubtful of acute flare    #CKD  - Reported, Kidney function normal here    #GERD  - Chronic, stable  - Continue Protonix    #Hypothyroidism  - Chronic, stable  - Continue synthroid    #Insomnia  - Chronic, stable  - Continue trazodone         Patient has BMI=Body mass index is 27.44 kg/m.  Diagnosis: Overweight based on BMI criteria     Disposition: (Please see PAF column for Expected D/C Date)  Today's date: 10/27/2021  Admit Date: 10/27/2021 11:09 AM  Service status: Inpatient: risk of morbidity and mortality and risk of progressive disease  Clinical Milestones: TBD  Anticipated discharge needs: Call Jessica Mcbride to set up private transport when [facility] ready to have transport up to [place] to assisted living    History of Presenting Illness:   Jessica Mcbride is a 87 y.o. female with history of   Polymyalgia rheumatica, Hx of temporal Arteritis, myelodysplastic syndrome, chronic anemia, HTN, CAD, Hx of TIA, Hypothyroidism, HLD, diverticulosis, CHF, aortic and MV regurgitation, CKD 3B, chronic pain, orthostatic hypotension, known calcified  granuloma of the lung, hx of urinary retention, who was been transported by BLS from [place] to [place] presents to the hospital with chest pain. History obtained from patient and daughter Jessica Mcbride (daughter, POA, and RN) who contributes.     Patient has reportedly been in and out of hospitalization and skilled rehab over the past year due to a variety of ailments to include hypertensive urgency, altered mental status, C. difficile, urinary tract infections, chest pain.  Patient states that the chest pain had initiated over the past year, the daughter states that it was related to her vertebral fractures which was suspected secondary to osteoporosis.  Due to repeated hospitalizations and overall decline in health, her daughters decided to move her mother out of her condo in [place] to an assisted living facility in [place] where the rest of the family are.     Of note, patient recently underwent kyphoplasty about 1.5 months ago for her L2 fracture.  She was recently diagnosed with recurrent UTI requiring ciprofloxacin as well as C. difficile infection status post vancomycin.  At rehab, they had retested her stools which were loose and was found to have ongoing C. difficile infection, started on fidaxomicin.  Patient has chronic urinary retention thought to be related to her vertebral fractures requiring intermittent Foley catheter placement. Patient was being transported by BLS from [place] to assisted living in [place] when she developed bilateral persistent chest pain with associated dyspnea not improving with scheduled medications.      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  Patient reports that her sister died last year from lung cancer which initially was a calcified granuloma.  Her sister has a history of penicillin allergies, due to having her sisters bone marrow, she has been empirically labeled with penicillin allergic as well.    In the  ED, patient was tachycardic to 112, afebrile, satting 100% on RA. Initial labs significant for leukocytosis 16.4, anemia 10.7 (appears close to baseline based on labs in 2021). BMP unremarkable. LFT significant for hypoalbuminemia. Troponin negative. UA significant for large LE, WBC, RBC. CTA chest demonstrated trace bilateral pleural effusions with atelectasis, subcentimeter calcified granuloma. She received Levofloxacin, Morphine 4mg , and called for admission.         Past Medical History:     Past Medical History:   Diagnosis Date    Anxiety     C. difficile diarrhea     HLD (hyperlipidemia)     HTN (hypertension)        Available old records reviewed, including:  [facility] Summary 08/09/2021    Past Surgical History:   Kyphoplasty ~09/2021    Family History:   Reviewed and noncontributory      Social History:     Social History     Tobacco Use   Smoking Status Never   Smokeless Tobacco  Never     Social History     Substance and Sexual Activity   Alcohol Use Never     Social History     Substance and Sexual Activity   Drug Use Never       Allergies:     Allergies   Allergen Reactions    Penicillins Anaphylaxis       Medications:     Home Medications       Med List Status: Pharmacy Completed Set By: Luanna Cole at 10/27/2021  3:09 PM      Status Comment        10/27/2021  3:09 PM     ED CPhT verified recent filled Rx's with pt pharmacy.               amLODIPine (NORVASC) 10 MG tablet     Take 10 mg by mouth daily     diclofenac (VOLTAREN) 75 MG EC tablet     Take 75 mg by mouth 2 (two) times daily     folic acid (FOLVITE) 1 MG tablet     Take 1 mg by mouth daily     leflunomide (ARAVA) 20 MG tablet     Take 20 mg by mouth daily     levothyroxine (SYNTHROID) 125 MCG tablet     Take 125 mcg by mouth Once a day at 6:00am     methocarbamol (ROBAXIN) 500 MG tablet     Take 500 mg by mouth 3 (three) times daily as needed     omeprazole (PriLOSEC) 20 MG capsule     Take 20 mg by mouth daily     pantoprazole (PROTONIX)  40 MG tablet     Take 40 mg by mouth daily     pravastatin (PRAVACHOL) 20 MG tablet     Take 20 mg by mouth daily     predniSONE (DELTASONE) 5 MG tablet     Take 5 mg by mouth daily     propranolol (INDERAL) 10 MG tablet     Take 10 mg by mouth daily     traZODone (DESYREL) 50 MG tablet     Take 25 mg by mouth nightly              Method by which medications were confirmed on admission: Patient    Review of Systems:   All other systems were reviewed and are negative except as documented in HPI    Physical Exam:   Patient Vitals for the past 24 hrs:   BP Temp Temp src Pulse Resp SpO2 Height Weight   10/27/21 1445 167/80 -- -- (!) 104 17 100 % -- --   10/27/21 1400 147/76 -- -- (!) 112 18 98 % -- --   10/27/21 1109 123/89 98.7 F (37.1 C) Oral (!) 108 20 99 % 1.676 m (5\' 6" ) 77.1 kg (170 lb)     Body mass index is 27.44 kg/m.  No intake or output data in the 24 hours ending 10/27/21 1524    General: Awake, Alert, Oriented x 3, with intermittent spasm-like pain  HEENT: Pupils equal, round, reactive to light, extra ocular movements intact, sclera anicteric, oropharynx clear without lesions, mucous membranes moist  Neck: Supple, no lymphadenopathy, no thyromegaly, no JVD, no carotid bruits  Cardiovascular: Regular rate and rhythm, no murmurs, rubs or gallops  Lungs: Decreased breath sounds at the bases  Abdomen: Soft, non-tender, non-distended; no palpable masses, no hepatosplenomegaly, normoactive bowel sounds, no rebound or  guarding  Extremities: No clubbing, cyanosis, or edema  Neuro: Cranial nerves grossly intact, strength 5/5 in upper and lower extremities, sensation intact  Skin: No rashes or lesions noted        Labs:     Results       Procedure Component Value Units Date/Time    Urinalysis Reflex to Microscopic Exam- Reflex to Culture [[id number]]  (Abnormal) Collected: 10/27/21 1409     Updated: 10/27/21 1448     Urine Type Urine, Clean Ca     Color, UA Yellow     Clarity, UA Hazy     Specific Gravity UA 1.016      Urine pH 7.0     Leukocyte Esterase, UA Large     Nitrite, UA Negative     Protein, UR 50= 1+     Glucose, UA Negative     Ketones UA Negative     Urobilinogen, UA Normal mg/dL      Bilirubin, UA Negative     Blood, UA Large     RBC, UA TNTC /hpf      WBC, UA TNTC /hpf      Squamous Epithelial Cells, Urine 0-5 /hpf     Troponin I [[id number]] Collected: 10/27/21 1140    Specimen: Blood Updated: 10/27/21 1233     Troponin I <0.01 ng/mL     Comprehensive metabolic panel [[id number]]  (Abnormal) Collected: 10/27/21 1140    Specimen: Blood Updated: 10/27/21 1229     Glucose 171 mg/dL      BUN 08.6 mg/dL      Creatinine 0.7 mg/dL      Sodium 578 mEq/L      Potassium 4.1 mEq/L      Chloride 98 mEq/L      CO2 25 mEq/L      Calcium 9.1 mg/dL      Protein, Total 6.4 g/dL      Albumin 2.7 g/dL      AST (SGOT) 24 U/L      ALT 28 U/L      Alkaline Phosphatase 83 U/L      Bilirubin, Total 0.4 mg/dL      Globulin 3.7 g/dL      Albumin/Globulin Ratio 0.7     Anion Gap 14.0    GFR [[id number]] Collected: 10/27/21 1140     Updated: 10/27/21 1229     EGFR >60.0       CBC and differential [[id number]]  (Abnormal) Collected: 10/27/21 1140    Specimen: Blood Updated: 10/27/21 1205     WBC 16.47 x10 3/uL      Hgb 10.7 g/dL      Hematocrit 27.2 %      Platelets 525 x10 3/uL      RBC 3.82 x10 6/uL      MCV 90.3 fL      MCH 28.0 pg      MCHC 31.0 g/dL      RDW 15 %      MPV 10.5 fL      Neutrophils 75.9 %      Lymphocytes Automated 12.5 %      Monocytes 7.8 %      Eosinophils Automated 3.0 %      Basophils Automated 0.5 %      Immature Granulocytes 0.3 %      Nucleated RBC 0.0 /100 WBC      Neutrophils Absolute 12.50 x10 3/uL      Lymphocytes Absolute Automated 2.06 x10 3/uL  Monocytes Absolute Automated 1.28 x10 3/uL      Eosinophils Absolute Automated 0.50 x10 3/uL      Basophils Absolute Automated 0.08 x10 3/uL      Immature Granulocytes Absolute 0.05 x10 3/uL      Absolute NRBC 0.00 x10 3/uL             Imaging personally reviewed,  including: CTA Chest 11/19    Safety Checklist  DVT prophylaxis:  CHEST guideline (See page [id number]) Chemical   Foley:  [place] Rn Foley protocol Present and continue: Indication: Urinary retention   IVs:  Peripheral IV   PT/OT: Ordered   Daily CBC & or Chem ordered:  SHM/ABIM guidelines (see #5) Yes, due to clinical and lab instability   Reference for approximate charges of common labs: CBC auto diff - $76  BMP - $99  Mg - $79    Signed by: Jessica Becton, MD   cc:No primary care provider on file.

## 2021-10-27 NOTE — Plan of Care (Incomplete)
Attending Attestation:     I have seen and personally examined the patient.  I agree with the history, exam, assessment, and management plans as documented by Dr. Marland Kitchen  I concur with or have edited all elements of the provider's note, with any caveats as follows    87 yo F actively being moved from [place] to [place] to be closer to family via medical transport who developed chest pain en route and pulled over to [facility] for evaluation. Admitted 10/27/21 for chest pain.       Labs on admission notable for: WBC 16.47, H/H 10.7/34.5, negative troponin, abnormal UA (large LE, large blood, TNTC RBC, TNTC WBC). CXR showed cardiomegaly, trace pleural effusions. CTA Chest showed no PE, trace bilateral effusions. EKG showed sinus tachycardia with PACs. Blood cultures and urine cultures were collected. Patient was started on levaquin. Patient was admitted to observation.     CT T/L spine showed ***  Echo showed ***    # Chest pain  # UTI  # ? CAP    # Leukocytosis  # Anemia, normocytic   # Thrombocytosis   # History of Cdiff, failed Vancomycin  # PMR, on prednisone 5mg  PTA  # History of temporal arteritis  # Fibromyalgia  # MDS s/p bone marrow transplant 1999  # History of CVA  # CKD3  # History of L2 compression fracture s/p vertebroplasty ~09/2021  # HTN  # HLD  # Hypothyroidism  # GERD  # Insomnia, on trazadone PTA    Plan:  ***  Does she have a foley?     Disposition:     Today's date: 10/27/2021  Admit Date: 10/27/2021 11:09 AM  Clinical Milestones: ***  Anticipated discharge needs: ***    Jerral Ralph, MD

## 2021-10-27 NOTE — Pharmacy Admission Med History Basic (Signed)
Admission Medication History - ADVANCED      Patient's Pharmacy Information:    Primary Pharmacy Updated in Epic: Yes  Preferred pharmacy:   [facility] [address] [place] - [address]  [address] [place] [address]  Phone: [phone] Fax: [phone]    [facility] - [place], [place] - [address]  [address]  [address]  [place] [address]  Phone: [phone] Fax: [phone]      Primary Source of Medication History: Pharmacy        Prior to Admission Medication List:  Home Medications       Med List Status: Pharmacy Completed Set By: Luanna Cole at 10/27/2021  3:09 PM      Status Comment        10/27/2021  3:09 PM     ED CPhT verified recent filled Rx's with pt pharmacy.               amLODIPine (NORVASC) 10 MG tablet     Take 10 mg by mouth daily     diclofenac (VOLTAREN) 75 MG EC tablet     Take 75 mg by mouth 2 (two) times daily     folic acid (FOLVITE) 1 MG tablet     Take 1 mg by mouth daily     leflunomide (ARAVA) 20 MG tablet     Take 20 mg by mouth daily     levothyroxine (SYNTHROID) 125 MCG tablet     Take 125 mcg by mouth Once a day at 6:00am     methocarbamol (ROBAXIN) 500 MG tablet     Take 500 mg by mouth 3 (three) times daily as needed     omeprazole (PriLOSEC) 20 MG capsule     Take 20 mg by mouth daily     pantoprazole (PROTONIX) 40 MG tablet     Take 40 mg by mouth daily     pravastatin (PRAVACHOL) 20 MG tablet     Take 20 mg by mouth daily     predniSONE (DELTASONE) 5 MG tablet     Take 5 mg by mouth daily     propranolol (INDERAL) 10 MG tablet     Take 10 mg by mouth daily     traZODone (DESYREL) 50 MG tablet     Take 25 mg by mouth nightly            Summary:    All medications added as there was no prior hx charted.     Luanna Cole

## 2021-10-27 NOTE — ED Notes (Signed)
Bone Marrow transplant 25 years ago and since then PNA

## 2021-10-27 NOTE — ED Provider Notes (Signed)
[facility] H&P      Visit date: 10/27/2021      CLINICAL SUMMARY           Diagnosis:    .     Final diagnoses:   Bacterial urinary infection         MDM Notes:      Ddx includes: Diaphragm spasm, musculoskeletal chest pain, ACS    This is an 87 year old woman who was in the process of being transported to [place] from another state. En route, she developed chest pain which she has had before but medics reported that her pain was not improved with her scheduled medications. As a result, they detoured to [facility] and subsequently left the patient here as their supervisor reportedly would not allow them to stay. Here, we treated her pain with some IV opioids with improvement, but she was also found to have a wBC of 16. With the tachycardia, we performed a CT to rule out PE. Scan was negative, but she was also found to have a UTI. With the UTI, elevated WBC, and no family in the area and no transport options, we will admit to observation for abx and arrangement of transfer to [place] as originally intended.          Disposition:         Observation Admit      ED Disposition       ED Disposition   Observation    Condition   --    Date/Time   Sat Oct 27, 2021  2:57 PM    Comment   Admitting Physician: Alan Mulder [[id number]]   Service:: Medicine [[id number]]   Estimated Length of Stay: < 2 midnights   Tentative Discharge Plan?: Home or Self Care [1]   Does patient need telemetry?: No                             CLINICAL INFORMATION        HPI:      Chief Complaint: Chest Pain  .    Jessica Mcbride is a 87 y.o. female with no PMHx of anxiety who presents with 1 hour of moderate persistent bilateral chest pain on the way to assisted living in [place].  Endorses difficulty breathing.  She has had previous episodes of the symptoms in the past, which resolves with time.  Taking tramadol and diazepam with little improvement.    Per EMS, patient was tachycardic  and her sats dropped to 93.  No history of MI.    History obtained from: Patient          ROS:      12 system ROS was negative in detail except as noted in HPI      Physical Exam:      Pulse (!) 108  BP 123/89  Resp 20  SpO2 99 %  Temp 98.7 F (37.1 C)    Physical Exam  Vitals and nursing note reviewed.   Constitutional:       Appearance: Normal appearance.   HENT:      Head: Normocephalic and atraumatic.      Right Ear: External ear normal.      Left Ear: External ear normal.      Nose: Nose normal.      Mouth/Throat:      Mouth: Mucous membranes are moist.      Pharynx: Oropharynx is  clear.   Eyes:      Extraocular Movements: Extraocular movements intact.      Conjunctiva/sclera: Conjunctivae normal.      Pupils: Pupils are equal, round, and reactive to light.   Cardiovascular:      Rate and Rhythm: Regular rhythm. Tachycardia present.      Heart sounds: Normal heart sounds. No murmur heard.    No friction rub. No gallop.   Pulmonary:      Effort: Pulmonary effort is normal. No respiratory distress.      Breath sounds: Normal breath sounds. No stridor. No wheezing, rhonchi or rales.   Chest:      Chest wall: No tenderness.   Abdominal:      General: Abdomen is flat. Bowel sounds are normal.      Palpations: Abdomen is soft.   Musculoskeletal:         General: Normal range of motion.      Cervical back: Normal range of motion and neck supple.   Skin:     General: Skin is warm and dry.   Neurological:      General: No focal deficit present.      Mental Status: She is alert and oriented to person, place, and time. Mental status is at baseline.   Psychiatric:         Mood and Affect: Mood normal.         Behavior: Behavior normal.         Thought Content: Thought content normal.         Judgment: Judgment normal.               PAST HISTORY        Primary Care Provider: No primary care provider on file.        PMH/PSH:    .     Past Medical History:   Diagnosis Date    Anxiety     C. difficile diarrhea     HLD  (hyperlipidemia)     HTN (hypertension)        She has no past surgical history on file.      Social/Family History:      She reports that she has never smoked. She has never used smokeless tobacco. She reports that she does not drink alcohol and does not use drugs.    History reviewed. No pertinent family history.      Listed Medications on Arrival:    .     Home Medications       Med List Status: In Progress Set By: Francena Hanly, RN at 10/27/2021 11:33 AM      No Medications           Allergies: She is allergic to penicillins.            VISIT INFORMATION        Clinical Course in the ED:                 Medications Given in the ED:    .     ED Medication Orders (From admission, onward)      Start Ordered     Status Ordering Provider    10/27/21 1453 10/27/21 1452  levoFLOXacin (LEVAQUIN) 750mg  in D5W IVPB (premix)  Once        Route: Intravenous  Ordered Dose: 750 mg     Tomma Lightning LUIS    10/27/21 1442 10/27/21 1441    Once  Route: Oral  Ordered Dose: 750 mg     Discontinued Jacqualyn Sedgwick LUIS    10/27/21 1422 10/27/21 1422  iohexol (OMNIPAQUE) 350 MG/ML injection 100 mL  IMG once as needed        Route: Intravenous  Ordered Dose: 100 mL     Last MAR action: Imaging Agent Given Delrae Sawyers LUIS    10/27/21 1333 10/27/21 1332  fentaNYL (PF) (SUBLIMAZE) injection 50 mcg  Once        Route: Intravenous  Ordered Dose: 50 mcg     Last MAR action: Given Tres Grzywacz LUIS    10/27/21 1123 10/27/21 1122  morphine injection 4 mg  Once        Route: Intravenous  Ordered Dose: 4 mg     Last MAR action: Given Shaquaya Wuellner LUIS              Procedures:      Procedures      Interpretations:      O2 sat-           saturation: 99 %; Oxygen use: room air; Interpretation: Normal    EKG -             interpreted by me: sinus tachycardia at 111.  Occasional PACs, otherwise normal intervals. No acute ST/T wave changes of ischemia noted.                 RESULTS        Lab Results:      Results        Procedure Component Value Units Date/Time    Urinalysis Reflex to Microscopic Exam- Reflex to Culture [[id number]]  (Abnormal) Collected: 10/27/21 1409     Updated: 10/27/21 1448     Urine Type Urine, Clean Ca     Color, UA Yellow     Clarity, UA Hazy     Specific Gravity UA 1.016     Urine pH 7.0     Leukocyte Esterase, UA Large     Nitrite, UA Negative     Protein, UR 50= 1+     Glucose, UA Negative     Ketones UA Negative     Urobilinogen, UA Normal mg/dL      Bilirubin, UA Negative     Blood, UA Large     RBC, UA TNTC /hpf      WBC, UA TNTC /hpf      Squamous Epithelial Cells, Urine 0-5 /hpf     Troponin I [[id number]] Collected: 10/27/21 1140    Specimen: Blood Updated: 10/27/21 1233     Troponin I <0.01 ng/mL     Comprehensive metabolic panel [[id number]]  (Abnormal) Collected: 10/27/21 1140    Specimen: Blood Updated: 10/27/21 1229     Glucose 171 mg/dL      BUN 08.6 mg/dL      Creatinine 0.7 mg/dL      Sodium 578 mEq/L      Potassium 4.1 mEq/L      Chloride 98 mEq/L      CO2 25 mEq/L      Calcium 9.1 mg/dL      Protein, Total 6.4 g/dL      Albumin 2.7 g/dL      AST (SGOT) 24 U/L      ALT 28 U/L      Alkaline Phosphatase 83 U/L      Bilirubin, Total 0.4 mg/dL      Globulin 3.7 g/dL  Albumin/Globulin Ratio 0.7     Anion Gap 14.0    GFR [[id number]] Collected: 10/27/21 1140     Updated: 10/27/21 1229     EGFR >60.0       CBC and differential [[id number]]  (Abnormal) Collected: 10/27/21 1140    Specimen: Blood Updated: 10/27/21 1205     WBC 16.47 x10 3/uL      Hgb 10.7 g/dL      Hematocrit 78.2 %      Platelets 525 x10 3/uL      RBC 3.82 x10 6/uL      MCV 90.3 fL      MCH 28.0 pg      MCHC 31.0 g/dL      RDW 15 %      MPV 10.5 fL      Neutrophils 75.9 %      Lymphocytes Automated 12.5 %      Monocytes 7.8 %      Eosinophils Automated 3.0 %      Basophils Automated 0.5 %      Immature Granulocytes 0.3 %      Nucleated RBC 0.0 /100 WBC      Neutrophils Absolute 12.50 x10 3/uL      Lymphocytes Absolute  Automated 2.06 x10 3/uL      Monocytes Absolute Automated 1.28 x10 3/uL      Eosinophils Absolute Automated 0.50 x10 3/uL      Basophils Absolute Automated 0.08 x10 3/uL      Immature Granulocytes Absolute 0.05 x10 3/uL      Absolute NRBC 0.00 x10 3/uL                 Radiology Results:      CT Angio Chest (PE or trauma protocol)   Final Result      1. No pulmonary embolism.   2. Trace bilateral pleural effusions and atelectasis.         Bosie Helper, MD    10/27/2021 2:43 PM      XR Chest  AP Portable   Final Result      Cardiomegaly, trace effusions and probable atelectasis. Streaky right   perihilar opacities may represent early pneumonia. Follow-up to   resolution is recommended.      Bosie Helper, MD    10/27/2021 11:38 AM                  Scribe Attestation:      I was acting as a Neurosurgeon for Lanae Crumbly, MD;* on [facility] [place]    I am the first provider for this patient and I personally performed the services documented. Llana Aliment is scribing for me on Tsang,Jessica Mcbride. This note accurately reflects work and decisions made by me.  Lanae Crumbly, MD;*            Lanae Crumbly, MD  10/27/21 1500

## 2021-10-27 NOTE — ED Notes (Signed)
Bed: S 8  Expected date:   Expected time:   Means of arrival:   Comments:

## 2021-10-28 DIAGNOSIS — D649 Anemia, unspecified: Secondary | ICD-10-CM

## 2021-10-28 DIAGNOSIS — J9601 Acute respiratory failure with hypoxia: Principal | ICD-10-CM

## 2021-10-28 DIAGNOSIS — D72829 Elevated white blood cell count, unspecified: Secondary | ICD-10-CM

## 2021-10-28 DIAGNOSIS — I951 Orthostatic hypotension: Secondary | ICD-10-CM

## 2021-10-28 DIAGNOSIS — R339 Retention of urine, unspecified: Secondary | ICD-10-CM

## 2021-10-28 LAB — CBC AND DIFFERENTIAL
Absolute NRBC: 0 10*3/uL (ref 0.00–0.00)
Basophils Absolute Automated: 0.06 10*3/uL (ref 0.00–0.08)
Basophils Automated: 0.4 %
Eosinophils Absolute Automated: 0.39 10*3/uL (ref 0.00–0.44)
Eosinophils Automated: 2.4 %
Hematocrit: 28.6 % — ABNORMAL LOW (ref 34.7–43.7)
Hgb: 8.9 g/dL — ABNORMAL LOW (ref 11.4–14.8)
Immature Granulocytes Absolute: 0.07 10*3/uL (ref 0.00–0.07)
Immature Granulocytes: 0.4 %
Lymphocytes Absolute Automated: 2.39 10*3/uL (ref 0.42–3.22)
Lymphocytes Automated: 14.4 %
MCH: 28.3 pg (ref 25.1–33.5)
MCHC: 31.1 g/dL — ABNORMAL LOW (ref 31.5–35.8)
MCV: 91.1 fL (ref 78.0–96.0)
MPV: 10.8 fL (ref 8.9–12.5)
Monocytes Absolute Automated: 1.39 10*3/uL — ABNORMAL HIGH (ref 0.21–0.85)
Monocytes: 8.4 %
Neutrophils Absolute: 12.29 10*3/uL — ABNORMAL HIGH (ref 1.10–6.33)
Neutrophils: 74 %
Nucleated RBC: 0 /100 WBC (ref 0.0–0.0)
Platelets: 438 10*3/uL — ABNORMAL HIGH (ref 142–346)
RBC: 3.14 10*6/uL — ABNORMAL LOW (ref 3.90–5.10)
RDW: 15 % (ref 11–15)
WBC: 16.59 10*3/uL — ABNORMAL HIGH (ref 3.10–9.50)

## 2021-10-28 LAB — BASIC METABOLIC PANEL
Anion Gap: 9 (ref 5.0–15.0)
BUN: 14 mg/dL (ref 7.0–21.0)
CO2: 25 mEq/L (ref 17–29)
Calcium: 8.6 mg/dL (ref 7.9–10.2)
Chloride: 97 mEq/L — ABNORMAL LOW (ref 99–111)
Creatinine: 0.7 mg/dL (ref 0.4–1.0)
Glucose: 80 mg/dL (ref 70–100)
Potassium: 3.8 mEq/L (ref 3.5–5.3)
Sodium: 131 mEq/L — ABNORMAL LOW (ref 135–145)

## 2021-10-28 LAB — B-TYPE NATRIURETIC PEPTIDE: B-Natriuretic Peptide: 54 pg/mL (ref 0–100)

## 2021-10-28 LAB — BLOOD GAS, VENOUS
Base Excess, Ven: 0.4 mEq/L
HCO3, Ven: 26.8 mEq/L
O2 Sat, Venous: 97.9 %
Temperature: 37
Venous Total CO2: 28.5 mEq/L
pCO2, Venous: 56.5 mmhg
pH, Ven: 7.298
pO2, Venous: 125 mmhg

## 2021-10-28 LAB — COVID-19 (SARS-COV-2): SARS CoV 2 Overall Result: NOT DETECTED

## 2021-10-28 LAB — MAGNESIUM: Magnesium: 1.8 mg/dL (ref 1.6–2.6)

## 2021-10-28 LAB — PROCALCITONIN: Procalcitonin: 0.15 ng/ml — ABNORMAL HIGH (ref 0.00–0.10)

## 2021-10-28 LAB — C-REACTIVE PROTEIN: C-Reactive Protein: 14 mg/dL — ABNORMAL HIGH (ref 0.0–1.1)

## 2021-10-28 LAB — SEDIMENTATION RATE: Sed Rate: 84 mm/Hr — ABNORMAL HIGH (ref 0–20)

## 2021-10-28 LAB — GFR: EGFR: >60

## 2021-10-28 MED ORDER — MIDODRINE HCL 5 MG PO TABS
5.0000 mg | ORAL_TABLET | Freq: Two times a day (BID) | ORAL | Status: DC | PRN
Start: 2021-10-28 — End: 2021-11-02

## 2021-10-28 MED ORDER — LISINOPRIL 10 MG PO TABS
20.0000 mg | ORAL_TABLET | Freq: Every day | ORAL | Status: DC
Start: 2021-10-28 — End: 2021-10-28

## 2021-10-28 MED ORDER — METHOCARBAMOL 500 MG PO TABS
500.0000 mg | ORAL_TABLET | Freq: Three times a day (TID) | ORAL | Status: DC
Start: 2021-10-28 — End: 2021-11-02
  Administered 2021-10-28 – 2021-11-02 (×15): 500 mg via ORAL
  Filled 2021-10-28 (×15): qty 1

## 2021-10-28 MED ORDER — FUROSEMIDE 10 MG/ML IJ SOLN
20.0000 mg | Freq: Once | INTRAMUSCULAR | Status: AC
Start: 2021-10-28 — End: 2021-10-28
  Administered 2021-10-28: 20 mg via INTRAVENOUS
  Filled 2021-10-28: qty 4

## 2021-10-28 MED ORDER — LIDOCAINE 5 % EX PTCH
1.0000 | MEDICATED_PATCH | CUTANEOUS | Status: DC
Start: 2021-10-28 — End: 2021-11-02
  Administered 2021-10-28 – 2021-11-02 (×6): 1 via TRANSDERMAL
  Filled 2021-10-28 (×6): qty 1

## 2021-10-28 MED ORDER — AMLODIPINE BESYLATE 5 MG PO TABS
10.0000 mg | ORAL_TABLET | Freq: Every day | ORAL | Status: DC
Start: 2021-10-28 — End: 2021-10-29
  Administered 2021-10-28 – 2021-10-29 (×2): 10 mg via ORAL
  Filled 2021-10-28 (×2): qty 2

## 2021-10-28 MED ORDER — ONDANSETRON HCL 4 MG/2ML IJ SOLN
4.0000 mg | Freq: Three times a day (TID) | INTRAMUSCULAR | Status: DC
Start: 2021-10-28 — End: 2021-11-02
  Administered 2021-10-28 – 2021-11-01 (×7): 4 mg via INTRAVENOUS
  Filled 2021-10-28 (×9): qty 2

## 2021-10-28 NOTE — Consults (Signed)
Office: [phone]  Epic GroupChat: "[facility])"     Date Time: 10/28/21 3:16 PM  Patient Name: Jessica Mcbride  Requesting Physician: Jerral Ralph, MD     Reason for Consultation:   Recurrent C diff and UTIs    Problem List:   Acute Problem List:     Admitted on 10/27/2021 for chest pain.  H/o C diff (unclear where). Apparently was treated with PO vanc with still positive DNA PCR so is now finishing a course a Fidaxomicin.     Recent C diff infection.    H/o recurrent UTI.  -- UA (11/19) - large LE; WBC = TNTC.    Leukocytosis.  Thrombocytosis.    Chronic Conditions:  PMR  Temoporal arteritis  MDS s/p BMT in 1999  HTN  CAD  H/o CVA  Hypothyroidism  HLD  Diverticulosis  CKD stage 3  H/o vertebral fractures  GERD  Insomnia  Calcified granuloma    Allergies:  Allergies   Allergen Reactions    Penicillins Anaphylaxis     Antimicrobials:   #2 IV Levofloxacin 500 mg daily 11/19 -    Estimated Creatinine Clearance: 61.6 mL/min (based on SCr of 0.7 mg/dL).    Assessment:   87 y.o. female with multiple medical problems including MDS s/p BMT in 1999 who presents to the hospital on 10/27/2021 with chest pain.    On admission, she was afebrile with a leukocytosis. She was requiring 2L NC. CTA chest with no PE and trace bilateral pleural effusions with a calcified granuloma is present in the right middle lobe    CT T and L spine with no evidence of fractures. She had known chronic compression fractures of T12, L1, and L2.    Of note, she is being treated for C diff diarrhea. Apparently still positive after a course of PO vanc so on Fidaxomicin now with 3 days left.     Started on levofloxacin on admission due to pyuria in the setting or a chronic foley for urinary retention.    Per patient, she has lived in [facility] for the last 6 years in a rehab where she recently got a foley placed for urinary retention and treated for C diff colitis. She was on her way to [place] to live with her  daughters when she started developed nausea and chest pain so she was brought here.     She attests to no overt diarrhea and no urinary complaints.     Recommendations:   Foley exchanged today. No need for antibiotics. Recommend to stop levofloxacin.  Can finish her course of Fidax. Symptomatically better. No need for repeat C diff DNA PCR.  Weaned her off supplemental O2. Saturating > 90% on RA.    ______________________________________________________________________  I have discussed my thoughts with the patient and with relevant medical team members I have considered the potential drug interactions between antimicrobial agents   I have recommended and other medications required by the patient and adjusted appropriately for renal function   Thank you for allowing me to participate in the care of this very interesting and pleasant patient    History:   Jessica Mcbride is a 87 y.o. female with multiple medical problems including MDS s/p BMT in 1999 who presents to the hospital on 10/27/2021 with chest pain.    On admission, she was afebrile with a leukocytosis. She was requiring 2L NC. CTA chest with no PE and trace bilateral pleural effusions with a calcified  granuloma is present in the right middle lobe    CT T and L spine with no evidence of fractures. She had known chronic compression fractures of T12, L1, and L2.    Of note, she is being treated for C diff diarrhea. Apparently still positive after a course of PO vanc so on Fidaxomicin now with 3 days left.     Started on levofloxacin on admission due to pyuria.     Lines:   No CVC.    *I have performed a risk-benefit analysis and the patient needs a central line for access and IV medications      Review of Systems:   Review of Systems - History obtained from the patient  General ROS: negative for - chills, fatigue, fever, malaise, or night sweats  Psychological ROS: negative for - anxiety or depression  Ophthalmic ROS: negative for - blurry vision or  decreased vision  ENT ROS: negative for - headaches, nasal congestion, nasal discharge, sinus pain, sneezing, or sore throat  Respiratory ROS: no cough, shortness of breath, or wheezing  Cardiovascular ROS: positive for - chest pain  Gastrointestinal ROS: positive for - nausea/vomiting  Genito-Urinary ROS: no dysuria, trouble voiding, or hematuria  Musculoskeletal ROS: negative  Neurological ROS: no TIA or stroke symptoms  Dermatological ROS: negative      Physical Exam:     Vitals:    10/28/21 1553   BP: 136/72   Pulse: 77   Resp: 16   Temp: 98.1 F (36.7 C)   SpO2: 97%     Physical Exam  Vitals reviewed.   Constitutional:       General: She is not in acute distress.     Appearance: Normal appearance. She is not ill-appearing, toxic-appearing or diaphoretic.   HENT:      Head: Normocephalic and atraumatic.      Nose: Nose normal. No congestion or rhinorrhea.      Mouth/Throat:      Mouth: Mucous membranes are moist.      Pharynx: Oropharynx is clear. No oropharyngeal exudate or posterior oropharyngeal erythema.   Eyes:      General: No scleral icterus.     Conjunctiva/sclera: Conjunctivae normal.   Cardiovascular:      Rate and Rhythm: Normal rate.   Pulmonary:      Effort: Pulmonary effort is normal. No respiratory distress.      Breath sounds: Normal breath sounds. No stridor. No wheezing or rhonchi.   Abdominal:      General: Bowel sounds are normal. There is no distension.      Palpations: Abdomen is soft.      Tenderness: There is no abdominal tenderness. There is no guarding or rebound.   Genitourinary:     Comments: Deferred.  Musculoskeletal:         General: No swelling or tenderness.      Cervical back: Neck supple.   Skin:     General: Skin is warm and dry.      Coloration: Skin is not jaundiced or pale.      Findings: No bruising, erythema or rash.   Neurological:      Mental Status: She is alert and oriented to person, place, and time. Mental status is at baseline.   Psychiatric:         Mood and  Affect: Mood normal.         Behavior: Behavior normal.         Thought Content: Thought content  normal.         Judgment: Judgment normal.           Past Medical History:     Past Medical History:   Diagnosis Date    Anxiety     C. difficile diarrhea     HLD (hyperlipidemia)     HTN (hypertension)        Past Surgical History:   History reviewed. No pertinent surgical history.    Family History:   History reviewed. No pertinent family history.    Social History:     Social History     Socioeconomic History    Marital status: Widowed     Spouse name: Not on file    Number of children: Not on file    Years of education: Not on file    Highest education level: Not on file   Occupational History    Not on file   Tobacco Use    Smoking status: Never    Smokeless tobacco: Never   Vaping Use    Vaping Use: Never used   Substance and Sexual Activity    Alcohol use: Never    Drug use: Never    Sexual activity: Not on file   Other Topics Concern    Not on file   Social History Narrative    Not on file     Social Determinants of Health     Financial Resource Strain: Not on file   Food Insecurity: Not on file   Transportation Needs: Not on file   Physical Activity: Not on file   Stress: Not on file   Social Connections: Not on file   Intimate Partner Violence: Not on file   Housing Stability: Not on file       Allergies:     Allergies   Allergen Reactions    Penicillins Anaphylaxis         Medications:     Current Facility-Administered Medications   Medication Dose Route Frequency    acetaminophen  1,000 mg Oral Q8H    amLODIPine  10 mg Oral Daily    enoxaparin  40 mg Subcutaneous Daily    fidaxomicin  200 mg Oral BID    folic acid  1 mg Oral Daily    leflunomide  20 mg Oral Daily    levoFLOXacin  500 mg Intravenous Q24H    levothyroxine  125 mcg Oral Daily at 0600    lidocaine  1 patch Transdermal Q24H    lidocaine  2 patch Transdermal Q24H    methocarbamol  500 mg Oral TID    ondansetron  4 mg Intravenous Q8H    pantoprazole   40 mg Oral QAM AC    pravastatin  20 mg Oral Daily    predniSONE  5 mg Oral Daily    propranolol  10 mg Oral Daily    traZODone  25 mg Oral QHS       Labs:     Lab Results   Component Value Date    WBC 16.59 (H) 10/28/2021    HGB 8.9 (L) 10/28/2021    HCT 28.6 (L) 10/28/2021    MCV 91.1 10/28/2021    PLT 438 (H) 10/28/2021     Lab Results   Component Value Date    CREAT 0.7 10/28/2021     Lab Results   Component Value Date    ALT 28 10/27/2021    AST 24 10/27/2021    ALKPHOS 83 10/27/2021  BILITOTAL 0.4 10/27/2021     No results found for: LACTATE    Microbiology:     Microbiology Results (last 15 days)       Procedure Component Value Units Date/Time    COVID-19 (SARS-CoV-2) only (Liat Rapid) asymptomatic admission - Hospitals [[id number]] Collected: 10/28/21 0948    Order Status: Completed Specimen: Nasopharyngeal Updated: 10/28/21 1023     Purpose of COVID testing Screening     SARS-CoV-2 Specimen Source Nasal Swab     SARS CoV 2 Overall Result Not Detected     Comment: __________________________________________________  -A result of "Detected" indicates POSITIVE for the    presence of SARS CoV-2 RNA  -A result of "Not Detected" indicates NEGATIVE for the    presence of SARS CoV-2 RNA  __________________________________________________________  Test performed using the Roche cobas Liat SARS-CoV-2 assay. This assay is  only for use under the Food and Drug Administrations Emergency Use  Authorization. This is a real-time RT-PCR assay for the qualitative  detection of SARS-CoV-2 RNA. Viral nucleic acids may persist in vivo,  independent of viability. Detection of viral nucleic acid does not imply the  presence of infectious virus, or that virus nucleic acid is the cause of  clinical symptoms. Negative results do not preclude SARS-CoV-2 infection and  should not be used as the sole basis for diagnosis, treatment or other  patient management decisions. Negative results must be combined with  clinical observations,  patient history, and/or epidemiological information.  Invalid results may be due to inhibiting substances in the specimen and  recollection should occur. Please see Fact Sheets for patients and providers  located:  WirelessDSLBlog.no         Narrative:      o Collect and clearly label specimen type:  o PREFERRED-Upper respiratory specimen: One Nasal Swab in  Transport Media.  o Hand deliver to laboratory ASAP  Indication for testing->Extended care facility admission to  semi private room  Screening    Culture Blood Aerobic and Anaerobic [[id number]] Collected: 10/27/21 1621    Order Status: Sent Specimen: Blood, Venipuncture Updated: 10/27/21 1739    Narrative:      The order will result in two separate 8-82ml bottles  Please do NOT order repeat blood cultures if one has been  drawn within the last 48 hours  UNLESS concerned for  endocarditis  AVOID BLOOD CULTURE DRAWS FROM CENTRAL LINE IF POSSIBLE  Indications:->Pneumonia  1 BLUE+1 PURPLE    Culture Blood Aerobic and Anaerobic [[id number]] Collected: 10/27/21 1621    Order Status: Sent Specimen: Blood, Venipuncture Updated: 10/27/21 1739    Narrative:      The order will result in two separate 8-72ml bottles  Please do NOT order repeat blood cultures if one has been  drawn within the last 48 hours  UNLESS concerned for  endocarditis  AVOID BLOOD CULTURE DRAWS FROM CENTRAL LINE IF POSSIBLE  Indications:->Pneumonia  1 BLUE+1 PURPLE    Urine culture [[id number]] Collected: 10/27/21 1409    Order Status: No result Specimen: Urine Updated: 10/27/21 1753            Rads:   CT Thoracic Spine WO Contrast    Result Date: 10/27/2021  1. No findings to suggest an acute thoracic compression fracture. Sequela of previous/likely chronic compression fractures of T12, L1 and L2 with postsurgical changes likely related to prior vertebroplasty. 2. Small pleural effusions with findings of the lungs better appreciated on the recent CT of the chest. Fredrich Birks,  MD  10/27/2021 7:23 PM    CT Lumbar Spine WO Contrast    Result Date: 10/27/2021  1. No findings to suggest an acute/new compression deformity of the lumbar spine, although assessment is limited as no prior baseline imaging is available. Probable chronic compression fractures involving the T12, L1 and L2 vertebral bodies, with postsurgical changes related likely related to prior vertebroplasty. 2. Mild anterior wedging/slightly diminished height of the L3 vertebral body anteriorly which is age-indeterminate, and may be chronic. Fredrich Birks, MD  10/27/2021 7:16 PM     Signed by: Anastasia Fiedler, DO, MD

## 2021-10-28 NOTE — Plan of Care (Signed)
Problem: Safety  Goal: Patient will be free from injury during hospitalization  Outcome: Progressing  Flowsheets (Taken 10/28/2021 0200 by Adriana Simas, RN)  Patient will be free from injury during hospitalization:   Assess patient's risk for falls and implement fall prevention plan of care per policy   Use appropriate transfer methods   Include patient/ family/ care giver in decisions related to safety   Provide and maintain safe environment   Ensure appropriate safety devices are available at the bedside   Hourly rounding   Assess for patients risk for elopement and implement Elopement Risk Plan per policy   Provide alternative method of communication if needed (communication boards, writing)  Goal: Patient will be free from infection during hospitalization  Outcome: Progressing  Flowsheets (Taken 10/28/2021 0200 by Adriana Simas, RN)  Free from Infection during hospitalization:   Assess and monitor for signs and symptoms of infection   Monitor lab/diagnostic results   Encourage patient and family to use good hand hygiene technique   Monitor all insertion sites (i.e. indwelling lines, tubes, urinary catheters, and drains)     Problem: Pain  Goal: Pain at adequate level as identified by patient  Outcome: Progressing  Flowsheets (Taken 10/28/2021 0200 by Adriana Simas, RN)  Pain at adequate level as identified by patient:   Identify patient comfort function goal   Assess for risk of opioid induced respiratory depression, including snoring/sleep apnea. Alert healthcare team of risk factors identified.   Assess pain on admission, during daily assessment and/or before any "as needed" intervention(s)   Reassess pain within 30-60 minutes of any procedure/intervention, per Pain Assessment, Intervention, Reassessment (AIR) Cycle   Evaluate if patient comfort function goal is met   Evaluate patient's satisfaction with pain management progress   Offer non-pharmacological pain management interventions    Consult/collaborate with Pain Service   Consult/collaborate with Physical Therapy, Occupational Therapy, and/or Speech Therapy   Include patient/patient care companion in decisions related to pain management as needed     Problem: Side Effects from Pain Analgesia  Goal: Patient will experience minimal side effects of analgesic therapy  Outcome: Progressing  Flowsheets (Taken 10/28/2021 0200 by Adriana Simas, RN)  Patient will experience minimal side effects of analgesic therapy:   Monitor/assess patient's respiratory status (RR depth, effort, breath sounds)   Prevent/manage side effects per LIP orders (i.e. nausea, vomiting, pruritus, constipation, urinary retention, etc.)   Assess for changes in cognitive function   Evaluate for opioid-induced sedation with appropriate assessment tool (i.e. POSS)     Problem: Discharge Barriers  Goal: Patient will be discharged home or other facility with appropriate resources  Outcome: Progressing  Flowsheets (Taken 10/28/2021 0200 by Adriana Simas, RN)  Discharge to home or other facility with appropriate resources:   Provide appropriate patient education   Provide information on available health resources     Problem: Psychosocial and Spiritual Needs  Goal: Demonstrates ability to cope with hospitalization/illness  Outcome: Progressing  Flowsheets (Taken 10/28/2021 0200 by Adriana Simas, RN)  Demonstrates ability to cope with hospitalizations/illness:   Encourage verbalization of feelings/concerns/expectations   Provide quiet environment   Assist patient to identify own strengths and abilities   Encourage patient to set small goals for self   Encourage participation in diversional activity   Reinforce positive adaptation of new coping behaviors   Include patient/ patient care companion in decisions     Problem: Compromised Tissue integrity  Goal: Damaged tissue is healing and protected  Outcome: Progressing  Flowsheets (Taken  10/28/2021 0200 by Adriana Simas, RN)  Damaged tissue is healing and protected:   Monitor/assess Braden scale every shift   Reposition patient every 2 hours and as needed unless able to reposition self   Increase activity as tolerated/progressive mobility   Relieve pressure to bony prominences for patients at moderate and high risk   Avoid shearing injuries   Keep intact skin clean and dry   Use bath wipes, not soap and water, for daily bathing   Use incontinence wipes for cleaning urine, stool and caustic drainage. Foley care as needed   Monitor external devices/tubes for correct placement to prevent pressure, friction and shearing   Monitor patient's hygiene practices   Encourage use of lotion/moisturizer on skin   Consult/collaborate with wound care nurse   Utilize specialty bed  Goal: Nutritional status is improving  Outcome: Progressing  Flowsheets (Taken 10/28/2021 0200 by Adriana Simas, RN)  Nutritional status is improving:   Assist patient with eating   Allow adequate time for meals   Encourage patient to take dietary supplement(s) as ordered   Collaborate with Clinical Nutritionist   Include patient/patient care companion in decisions related to nutrition     Problem: Bladder/Voiding  Goal: Remains continent  Outcome: Progressing  Flowsheets (Taken 10/28/2021 0200 by Adriana Simas, RN)  Remains continent:   Encourage toileting   Monitor intake and output   Encourage intermittent catheterization per implemented schedule   Encourage patient to empty bladder at regular intervals   Encourage patient to call for help when getting up to use the bathroom   Encourage patient to identify medications that aid bladder function prior to administration   Utilize bladder scans prior to or post void as appropriate  Goal: Perineal skin integrity is maintained or improved  Outcome: Progressing  Flowsheets (Taken 10/28/2021 0200 by Adriana Simas, RN)  Perineal skin integrity is maintained or improved:   Keep intact skin clean and  dry   Apply urinary containment device as appropriate and/or per order   Use protective skin barriers to decrease potential skin breakdown  Goal: Free from infection  Outcome: Progressing  Flowsheets (Taken 10/28/2021 0200 by Adriana Simas, RN)  Free from infection:   Monitor/assess for signs and symptoms of infection   Assess need for indwelling catheter every shift and discuss with LIP  Goal: Patient will experience proper bladder emptying during admission  Outcome: Progressing  Flowsheets (Taken 10/28/2021 0200 by Adriana Simas, RN)  Patient will experience proper bladder emptying during admission:   Monitor intake and output   Encourage patient to empty bladder at regular intervals   Utilize bladder scans prior to or post void as appropriate   Patient will be in and out catheterized if unable to void   Encourage non-pharmacological interventions ( ie. Increase fluids, avoid caffeinated drinks)     Problem: Chest Pain  Goal: Vital signs and cardiac rhythm stable  Outcome: Progressing  Flowsheets (Taken 10/28/2021 0202 by Adriana Simas, RN)  Vital signs and cardiac rhythm stable:   Monitor /assess vital signs/cardiac rhythms   Monitor labs   Assess the need for oxygen therapy and administer as ordered  Goal: Cardiac pain management  Outcome: Progressing  Flowsheets (Taken 10/28/2021 0202 by Adriana Simas, RN)  Cardiac pain management:   Assess/report chest pain/or related discomfort to LIP immediately   Instruct patient to report any change in pain status   Assess pain/or related discomfort on admission, during daily assessment, before and after any intervention   Include  patient and patient care companion in decisions related to pain management  Goal: Anxiety management/effective coping  Outcome: Progressing  Flowsheets (Taken 10/28/2021 0202 by Adriana Simas, RN)  Anxiety management/effective coping:   Assess/report uncontrolled anxiety, depression or ineffective coping to LIP    Encourage patient to immediately report any increase in anxiety and/or depression   Offer reassurance to decrease anxiety   Include patient in decision making of their care and give updates on their health status  Goal: Patient/Patient Care Companion demonstrates understanding of disease process, treatment plan, medications, and discharge plan  Outcome: Progressing  Flowsheets (Taken 10/28/2021 0202 by Adriana Simas, RN)  Patient/Patient Care Companion demonstrates understanding of disease process, treatment plan, medications and discharge plan:   Educate patient to immediately report any chest pain/equivalent to RN   Reinforce with patient their activity level and to avoid Valsalva   Reinforce regarding cardiac diet and any fluid parameters   Assist patient/patient care companion to identify measures for cardiac risk factor management   Consult/collaborate with Cardiac Rehabilitation   Assess need for smoking cessation and substance abuse counseling and refer as needed   Educate patient/patient care companion regarding identified learning needs   Plan for discharge   Nutrition consult as needed

## 2021-10-28 NOTE — Progress Notes (Addendum)
Adult Observation Progress Note     Shift Note: Received a report from the outgoing nurse. Introduced self to the patient. White board updated. Tele shows NSR. Patient scores high fall risk. Call bell within reach. Bed alarm on. Fall mat in place.     Foley care done, CHG bath given, Foley present on admission      Significant Event: Patient was nauseous. Zofran given 2x. Zofran helped to alleviate her nausea.      Neuro: A&Ox2 and forgetful      Cardiac: NSR, S1&S2 present     IV: 20 G Right AC     Respiratory: RA, Diminished lung sound      GU: Incontinent, Foley,      GI/BM: No BM in my shift, Active bowel sound in all 4 quadrants, Last BM unknown     Integumentary: Blanchable redness on sacrum      Diet: Consistent carb      Activity level: UTA, Has her own wheelchair. Patient can turn self. Educated the patient to turn self to prevent skin breakdown.      Pain: Has spasm pain under her breast. Tylenol and Tramadol given (helped to manage her pain).      Pending Orders: IV and PO antibiotic, Pain management, Nausea management, PT/OT, Foley care    Discharge Plan: TBD     Social or Family Visit: None     POC Update: Patient             BP 105/60    Pulse 95    Temp 97.9 F (36.6 C) (Oral)    Resp 21    Ht 1.676 m (5\' 6" )    Wt 77.1 kg (170 lb)    SpO2 92%    BMI 27.44 kg/m

## 2021-10-28 NOTE — PT Eval Note (Signed)
Physical Therapy Evaluation  Jessica Maes Cannaday      Post Acute Care Therapy Recommendations:   Discharge Recommendations:  ALF, Home with home health PT (as planned pta)    If ALF, Home with home health PT (as planned pta)  recommended discharge disposition is not available, patient will need hands on assist for mobility and self care, 1 level set-up, and HHPT services.     DME needs IF patient is discharging home: Patient already has needed equipment    Therapy discharge recommendations may change with patient status.  Please refer to most recent note for up-to-date recommendations.    Assessment:   Significant Findings: dizzy in sitting EOB, BP 94/59    Jessica Mcbride is a 87 y.o. female with multiple medical issues, limited functional mobility admitted 10/27/2021 during medical transport from [facility] to [place] with acute CP. +UTI.    Upon examination, pt demonstrates good functional movement initiation for bed mobility, though overall activity tolerance limited by both acute on chronic back pain and c/o dizziness linked to orthostatic hypotension.   Recommend cont PT services for gait and mobility progression, maximize independence to facilitate return home.     Therapy Diagnosis: Decreased functional mobility, Decreased functional independence     Rehabilitation Potential: Good for selected goals    Treatment Activities: eval completed  Educated the patient to role of physical therapy, plan of care, goals of therapy and safety with mobility and ADLs.    Plan:   PT Frequency: 2-3x/wk    Treatment/Interventions: Therex, transfers, gait, WC mobility    Risks/benefits/POC discussed with pt      Unit: [facility] TOWER 6  Bed: F631/F631.01     Precautions and Contraindications:   Falls  Orthostatic blood pressure   Adult Admit to Observation       Consult received for Jessica Mcbride for PT Evaluation and Treatment.  Patients medical condition is appropriate for Physical Therapy  intervention at this time.    History of Present Illness:   Jessica Mcbride is a 87 y.o. female admitted on 10/27/2021. Per H+P: "  Jessica Mcbride is a 87 y.o. female with history of   Polymyalgia rheumatica, Hx of temporal Arteritis, myelodysplastic syndrome, chronic anemia, HTN, CAD, Hx of TIA, Hypothyroidism, HLD, diverticulosis, CHF, aortic and MV regurgitation, CKD 3B, chronic pain, orthostatic hypotension, known calcified granuloma of the lung, hx of urinary retention, who was been transported by BLS from [place] to [place] presents to the hospital with chest pain. History obtained from patient and daughter Jessica Mcbride (daughter, POA, and RN) who contributes.      Patient has reportedly been in and out of hospitalization and skilled rehab over the past year due to a variety of ailments to include hypertensive urgency, altered mental status, C. difficile, urinary tract infections, chest pain.  Patient states that the chest pain had initiated over the past year, the daughter states that it was related to her vertebral fractures which was suspected secondary to osteoporosis.  Due to repeated hospitalizations and overall decline in health, her daughters decided to move her mother out of her condo in [place] to an assisted living facility in [place] where the rest of the family are.      Of note, patient recently underwent kyphoplasty about 1.5 months ago for her L2 fracture.  She was recently diagnosed with recurrent UTI requiring ciprofloxacin as well as C. difficile infection status post vancomycin.  At rehab, they had retested her  stools which were loose and was found to have ongoing C. difficile infection, started on fidaxomicin.  Patient has chronic urinary retention thought to be related to her vertebral fractures requiring intermittent Foley catheter placement. Patient was being transported by BLS from [place] to assisted living in [place] when she developed bilateral persistent  chest pain with associated dyspnea not improving with scheduled medications.       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  Patient reports that her sister died last year from lung cancer which initially was a calcified granuloma.  Her sister has a history of penicillin allergies, due to having her sisters bone marrow, she has been empirically labeled with penicillin allergic as well.     In the ED, patient was tachycardic to 112, afebrile, satting 100% on RA. Initial labs significant for leukocytosis 16.4, anemia 10.7 (appears close to baseline based on labs in 2021). BMP unremarkable. LFT significant for hypoalbuminemia. Troponin negative. UA significant for large LE, WBC, RBC. CTA chest demonstrated trace bilateral pleural effusions with atelectasis, subcentimeter calcified granuloma. She received Levofloxacin, Morphine 4mg , and called for admission."       Medical Diagnosis: Bacterial urinary infection [N39.0, A49.9]    Past Medical/Surgical History:  Past Medical History:   Diagnosis Date    Anxiety     C. difficile diarrhea     HLD (hyperlipidemia)     HTN (hypertension)      .psh    Imaging/Tests/Labs:  CT Angio Chest (PE or trauma protocol)    Result Date: 10/27/2021  1. No pulmonary embolism. 2. Trace bilateral pleural effusions and atelectasis. Bosie Helper, MD  10/27/2021 2:43 PM    CT Thoracic Spine WO Contrast    Result Date: 10/27/2021  1. No findings to suggest an acute thoracic compression fracture. Sequela of previous/likely chronic compression fractures of T12, L1 and L2 with postsurgical changes likely related to prior vertebroplasty. 2. Small pleural effusions with findings of the lungs better appreciated on the recent CT of the chest. Fredrich Birks, MD  10/27/2021 7:23 PM    CT Lumbar Spine WO Contrast    Result Date: 10/27/2021  1. No findings to suggest an acute/new compression deformity of the lumbar spine, although assessment is limited  as no prior baseline imaging is available. Probable chronic compression fractures involving the T12, L1 and L2 vertebral bodies, with postsurgical changes related likely related to prior vertebroplasty. 2. Mild anterior wedging/slightly diminished height of the L3 vertebral body anteriorly which is age-indeterminate, and may be chronic. Fredrich Birks, MD  10/27/2021 7:16 PM    XR Chest  AP Portable    Result Date: 10/27/2021  Cardiomegaly, trace effusions and probable atelectasis. Streaky right perihilar opacities may represent early pneumonia. Follow-up to resolution is recommended. Bosie Helper, MD  10/27/2021 11:38 AM     Recent Labs   Lab 10/28/21  0311   WBC 16.59*   Hgb 8.9*   Hematocrit 28.6*   Platelets 438*     Recent Labs   Lab 10/28/21  0311   Sodium 131*   Potassium 3.8   Chloride 97*   CO2 25   BUN 14.0   Creatinine 0.7   EGFR >60.0   Glucose 80   Calcium 8.6       Social History:   Prior Level of Function: assisted mobility, ADLs  Assistive Devices: FWW for short distances, WC   Baseline Activity: [place]  DME Currently at Home:  WC, FWW  Home Living Arrangements: was transitioning from [facility] to an ALF in [place] to be closer to family    Subjective:   Patient is agreeable to participation in the therapy session. Nursing clears patient for therapy.     Patient Goal: get to [place]    Pain:   Scale: 7/10  Location: ribs underneath breasts  Intervention: activity, repositioning    Objective:   Patient is in bed with dressings, telemetry, peripheral IV, and indwelling urinary catheter in place.  Pt wore mask during therapy session:No      Cognitive Status and Neuro Exam:  A+Ox4. Pleasant and cooperative. Follows all commands.     Musculoskeletal Examination  RUE ROM: WFL  LUE ROM: WFL  RLE ROM: WFL  LLE ROM: WFL    RUE strength: grossly 4-/5  LUE strength: grossly 4-/5  RLE strength: grossly 4-/5  LLE strength: grossly 4-/5      Functional Mobility  Rolling: mod I  Supine to Sit: min A. x2 reps  Scooting: SBA  Sit to  Stand: pt deferred  Sit to Supine: mod I    PMP - Progressive Mobility Protocol   PMP Activity: Step 4 - Dangle at Bedside     Vitals:  Supine: BP 122/69  Sitting: BP 94/59 +dizziness, +pain  Return to supine: BP 137/76      Balance  Static Sitting: Good  Dynamic Sitting: Good    Participation and Activity Tolerance  Participation Effort: Good  Endurance: limited by pain, dizziness        Patient left supine in bed with call bell within reach, all needs met, SCDs not in room, fall mat in place, bed alarm on, and all questions answered. RN notified of session outcome and patient response.     Goals:  Goals  Goal Formulation: With patient  Time for Goal Acheivement: By time of discharge  Goals: Select goal  Pt Will Perform Sit to Stand: with stand by assist  Pt Will Transfer Bed/Chair: with rolling walker, with stand by assist  Pt Will Ambulate: 11-30 feet, with rolling walker, with stand by assist  Pt Will Propel Wheelchair: 51-150 feet, modified independent      PPE worn during session: procedural mask, gown, and gloves    Tech present: n/a  PPE worn by tech: N/A    Time of Treatment  PT Received On: 10/28/21  Start Time: 0950  Stop Time: 1020  Time Calculation (min): 30 min    Alfonso Ellis, [place]  Pager [phone]

## 2021-10-28 NOTE — Progress Notes (Signed)
10/28/21 0820 10/28/21 0823   Vital Signs   Heart Rate 99 (!) 110   BP 132/73 (!) 85/50   BP Location Left arm Left arm   BP Method Automatic Automatic   Patient Position Lying Sitting   Orthostatic positive this morning, patient has a hx of being orthostatic, midodrine ordered. Ted hose and abdominal binder on

## 2021-10-28 NOTE — Plan of Care (Signed)
Jessica Mcbride  [id number]  23-Oct-1934      ID consulted and recommends stopping levaquin as UA was checked before foley was exchanged.       -will continue Dificid  - Recheck Cdiff as ordered by ID    Sherol Dade, FNP

## 2021-10-28 NOTE — UM Notes (Signed)
87 YEAR OLD FEMALE CAME TO [facility] ER WITH C/O CHEST SPASMS, SOB.    VS-BP- 123/89, P- 100-111, R- 24, T- 98.7    LABS- WBC-16.47, H/H 10/34, PLT 525    UA- YELLOW HAZY URINE, LARGE LEUKOCYTES, PROTEIN 50, LARGE BLOOD, RBC TNTC, WBC TNTC    CXR-Cardiomegaly, trace effusions and probable atelectasis. Streaky right   perihilar opacities may represent early pneumonia. Follow-up to   resolution is recommended.     CT THORACIC SPINE-  1. No findings to suggest an acute thoracic compression fracture. Sequela of previous/likely chronic compression fractures of T12, L1 and L2 with postsurgical changes likely related to prior vertebroplasty.     2. Small pleural effusions with findings of the lungs better appreciated on the recent CT of the chest.     CTA CHEST-  1. No pulmonary embolism.   2. Trace bilateral pleural effusions and atelectasis.     EKG-   SINUS TACHYCARDIA WITH PREMATURE ATRIAL COMPLEXES   POSSIBLE LEFT ATRIAL ENLARGEMENT   LEFT VENTRICULAR HYPERTROPHY   INFERIOR MYOCARDIAL INFARCTION , AGE UNDETERMINED   NONSPECIFIC T WAVE ABNORMALITY   ABNORMAL ECG   NO PREVIOUS ECGS AVAILABLE             PLAN- ADMIT TO OBSERVATION CLASS  10/27/2021 1457    #Chest pain  - Acute on chronic  - Suspect muscle spasm in etiology  - Was previously on Robaxin (will continue)  ---States she has been receiving Diazepam as needed for muscle spasms as well (noted in her bag of medications)  Pain plan  Continue Robaxin  Start Diazepam 2mg  q4H PRN for muscle spasms not improving with Robaxin  Start Tylenol 1g q8H standing  Start Tramadol for mild pain, Oxycodone 2.5/5 for mod/severe pain, Dilaudid 0.2 mg IV for severe not tolerating PO     F/u BNP, Echo given history of reported CHF (denies) with Aortic and MV regurg (says it self resolved)     #Dyspnea  - Acute  - Suspect pain related, CTA chest negative for PE  - Small effusions may cause some changes in chest wall dynamics causing dyspnea  - Daughter reporting previous chest pain SOB  when she had another spontaneous vertebral fracture  - Not hypoxic, on 2L for comfort and reports SOB  Check CRP, ESR, Procalcitonin  F/u CT thoracic and lumbar spine for new fracture  T/c Pulm consult for calcified granuloma and reported history of lung cancer from calcified granuloma in sister  Wean O2 as tolerated SpO2>90%     #History of Vertebral fractures  - Chronic, likely 2/2 osteoporosis exacerbated by chronic steroids  F/u CT as above  Trial Lidocaine patch along thoracic and vertebral spine  Pain plan as above  PT/OT eval ordered     #Calcified granuloma  -Noted  - Plan as above     #Pyuria  #Urinary retention  - Chronic?  - With foley, reports getting chest pains from UTIs as well (though daughter tells me patient may have vascular dementia so is not always reliable historian)  F/u Ucx  Continue Levofloxacin (5 day course)  Appreciate ID guidance     #C diff  - Acute, reportedly failed Vancomycin (last dose on 11/9 but diarrhea tested positive?)  - Continue fidaxomicin for now, has only 3 pills remaining in bag  - ID consulted, appreciate recs     #HTN  #Orthostasis  - Chronic  Continue Amlodipine and propranolol  SBP goal <186mmHg  Start Compression stockings  Check Orthostatics qshift     #PMR  - Chronic, stable  - Continue Prednisone 5mg   - Check CRP/ESR, doubtful of acute flare     #CKD  - Reported, Kidney function normal here     #GERD  - Chronic, stable  - Continue Protonix     #Hypothyroidism  - Chronic, stable  - Continue synthroid     #Insomnia  - Chronic, stable  - Continue trazodone    Adult Admit to Observation (Order #[id number]) on 10/27/21    Chest Pain: Observation Care - Observation Care Admission Criteria    UTILIZATION REVIEW CONTACT: Name: PAM Adela Esteban  Utilization Review  [facility]  Address:  [address] [place], [place]  [address]  NPI:   [id number]  Tax ID:  [id number]  Phone: [phone] voice mail only  Fax: [phone]    Please use fax number [phone] to provide authorization for hospital services or to request additional information.        NOTES TO REVIEWER:     This clinical review is based on/compiled from documentation provided by the treatment team within the patients medical record.

## 2021-10-28 NOTE — Plan of Care (Signed)
Problem: Safety  Goal: Patient will be free from injury during hospitalization  10/28/2021 0200 by Adriana Simas, RN  Outcome: Progressing  Flowsheets (Taken 10/28/2021 0200)  Patient will be free from injury during hospitalization:   Assess patient's risk for falls and implement fall prevention plan of care per policy   Use appropriate transfer methods   Include patient/ family/ care giver in decisions related to safety   Provide and maintain safe environment   Ensure appropriate safety devices are available at the bedside   Hourly rounding   Assess for patients risk for elopement and implement Elopement Risk Plan per policy   Provide alternative method of communication if needed (communication boards, writing)  10/28/2021 0159 by Adriana Simas, RN  Outcome: Progressing  Goal: Patient will be free from infection during hospitalization  10/28/2021 0200 by Adriana Simas, RN  Outcome: Progressing  Flowsheets (Taken 10/28/2021 0200)  Free from Infection during hospitalization:   Assess and monitor for signs and symptoms of infection   Monitor lab/diagnostic results   Encourage patient and family to use good hand hygiene technique   Monitor all insertion sites (i.e. indwelling lines, tubes, urinary catheters, and drains)  10/28/2021 0159 by Adriana Simas, RN  Outcome: Progressing

## 2021-10-28 NOTE — Progress Notes (Addendum)
MEDICINE PROGRESS NOTE    Date Time: 10/28/21 11:08 AM  Patient Name: Jessica Mcbride  Attending Physician: Jerral Ralph, MD    CC: chest pain    Assessment:   Hospital Course  Jessica Mcbride is a 87 y.o. female with a PMHx of PMR, temporal arteritis, myelodysplastic syndrome s/p BMT 1999, chronic anemia, HTN, CAD, hx of CVA, hypothyroidism, HDL, diverticulosis, CKD 3B, chronic chest pain, chronic orthostatic hypotension, urinary retention (PTA foley cath), c. Diff failed oral vancomycin (on fidaxomicin), recent UTI, vertebral fractures, L2 kyphoplasty 9/22, admitted 10/27/2021 to observation for  who presents to the hosptial w/ back pain that wraps around bilateral chest under breasts, shortness of breath, presumably hypoxic as she arrived to ED on 5L NC with EMS. Arrived to ED with BP 105/60, HR 95, RR 21, 95% on room air, afebrile. EKG shows sinus tachycardia, HR 111. Labs on admission were notable for WBC 16.74, H&H 10.7/34.5, PLT 525. UA noted large leukocytes, 1+ protein, large blood, RBC & WBC TNTC. BC and urine culture collected.  CRP 14, ESR 84.  CXR showed cardiomegaly, trace effusion and streaky right perihilar opacities, concern for PNA. Procal 0.15. BNP 54. CT T and L spine shows chronic compression fx of T12. CTA chest done d/t persistent tachycardiac in ED, no evidence of PE. Chest pain more consistent with MSK pain. Started on multimodal pain medication that is consistent for musculature pain. Started on levofloxacin for UTI. Foley exchanged on 11/20. IV lasix given for suspected mild fluid overload. ID consulted.      Interval History:  11/20- decrease O2 use, IV lasix, scheduled zofran and robaxin, ID consult      Plan:       #acute hypoxemic resp failure 2/2 small pleural effusion vs deconditioning vs splinting from CP  - desated to 85s on room air, with O2 2L she remains higher than 88% with exertion    - covid negative  -Procal 0.15, unlikely PNA.  - venous blood glass  unremarkable  - wean down oxygen use as able  - IV lasix 20mg  once to diuresis   - incentive spirometry ordered.   -SpO2 goal 90% or higher    # Chest pain 2/2 musculoskeletal, possibly from compression fractures  # chronic compression fractures s/p kyphoplasty T12 - L2  - continue lidocaine patch scheduled  - start robaxin scheduled   - continue scheduled tylenol   - valium and oxycodone PRN     #CAUTI  #urinary retention requiring foley cath PTA  - Foley exchanged today  - continue levofloxacin  - urine culture pending  - BC pending   - ID consulted, appreciate upcoming recommendations     #Hx of Cdiff, failed oral vancomycin   - continue fidaxomicin per report PTA due to end on ? 11/20  - ID consult, appreciate upcoming recommendations   - repeat c.diff test ordered by ID    #leukocytosis   - possibly d/t CAUTI and/or c.diff  - BC pending   - treat c.diff and CAUTI as listed above  - CBC in AM    #Chronic orthostatic hypotension   - start midodrine PRN for SBP < 90 at rest for now, consider continuing on discharge  - abdominal binder ordered  - TED hose ordered    #Chronic anemia   - H/H 8.9/28.6 today, no signs of acute bleeding   - Repeat CBC in the morning   - Transfuse for hgb <7    #PMR  #  hx of temporal arteritis   #hx of fibromyalgia   - continue prednisone   - continue arava  - pt does not feel like she is in a flare   - ESR 84, CRP 14    #HTN  -continue amlodipine and inderal     #Hypothyroid  - continue synthroid  - check TSH in AM    #HDL  #hx of CVA  - continue pravastatin     #Insomnia  - continue trazodone     #MDS s/p BMT 1999  - noted     #CKD3  - creatinine 0.7  - renally dose meds, avoid nephrotoxic agents  - BMP in AM    #Hyponatremia - mild  - Na 131 today  - suspect d/t poor PO intake  - BMP in AM    #Nausea  - schedule zofran    #GERD  - continue Protonix       Expected [place]: Discharge likely tomorrow pending oxygen use  DVT ppx: LOVENOX  Interpreter: no, not indicated  Code Status: FULL  CODE  Diet: Cardiac Diet and Consistant Carbohydrate Diet    Please see attending note that follows this mid-level encounter note.   Review of Systems:   Review of Systems - History obtained from the patient  Review of Systems   Constitutional:  Positive for malaise/fatigue. Negative for chills and fever.   Respiratory:  Positive for shortness of breath.    Cardiovascular:  Positive for chest pain.   Gastrointestinal:  Positive for nausea. Negative for abdominal pain, blood in stool and vomiting.   Genitourinary:  Negative for hematuria.   Musculoskeletal:  Positive for myalgias. Negative for back pain.   Neurological:  Negative for dizziness, seizures and headaches.   Physical Exam:   Physical Exam  Constitutional:       Appearance: Normal appearance.   HENT:      Head: Normocephalic and atraumatic.      Mouth/Throat:      Mouth: Mucous membranes are moist.      Pharynx: Oropharynx is clear.   Eyes:      Conjunctiva/sclera: Conjunctivae normal.      Pupils: Pupils are equal, round, and reactive to light.   Cardiovascular:      Rate and Rhythm: Normal rate and regular rhythm.      Pulses: Normal pulses.      Heart sounds: Normal heart sounds. No murmur heard.  Pulmonary:      Effort: Pulmonary effort is normal. No respiratory distress.      Breath sounds: Normal breath sounds. No wheezing.   Abdominal:      General: Abdomen is flat. There is no distension.      Palpations: Abdomen is soft.      Tenderness: There is no abdominal tenderness. There is no guarding.   Musculoskeletal:         General: No swelling. Normal range of motion.      Cervical back: Normal range of motion and neck supple.   Skin:     General: Skin is warm.      Capillary Refill: Capillary refill takes 2 to 3 seconds.      Coloration: Skin is pale.   Neurological:      General: No focal deficit present.      Mental Status: She is alert and oriented to person, place, and time. Mental status is at baseline.   Psychiatric:         Mood and Affect:  Mood normal.     VITAL SIGNS PHYSICAL EXAM   Temp:  [97.9 F (36.6 C)-98.8 F (37.1 C)] 98.5 F (36.9 C)  Heart Rate:  [85-112] 85  Resp Rate:  [16-24] 17  BP: (105-167)/(60-89) 128/71  Blood Glucose:    Telemetry: NSR      Intake/Output Summary (Last 24 hours) at 10/28/2021 1108  Last data filed at 10/28/2021 0400  Gross per 24 hour   Intake --   Output 500 ml   Net -500 ml    Physical Exam  General: awake, alert X 4  Cardiovascular: regular rate and rhythm, no murmurs, rubs or gallops  Lungs: clear to auscultation bilaterally, without wheezing, rhonchi, or rales  Abdomen: soft, non-tender, non-distended; no palpable masses,  normoactive bowel sounds  Extremities: no edema  Other: n/a        Meds:     Current Facility-Administered Medications   Medication Dose Route Frequency    acetaminophen  1,000 mg Oral Q8H    amLODIPine  10 mg Oral Daily    enoxaparin  40 mg Subcutaneous Daily    fidaxomicin  200 mg Oral BID    folic acid  1 mg Oral Daily    leflunomide  20 mg Oral Daily    levoFLOXacin  500 mg Intravenous Q24H    levothyroxine  125 mcg Oral Daily at 0600    lidocaine  1 patch Transdermal Q24H    lidocaine  2 patch Transdermal Q24H    methocarbamol  500 mg Oral TID    ondansetron  4 mg Intravenous Q8H    pantoprazole  40 mg Oral QAM AC    pravastatin  20 mg Oral Daily    predniSONE  5 mg Oral Daily    propranolol  10 mg Oral Daily    traZODone  25 mg Oral QHS     Labs:     Recent Labs     10/28/21  0311 10/27/21  1140   WBC 16.59* 16.47*   Hgb 8.9* 10.7*   Hematocrit 28.6* 34.5*   Platelets 438* 525*   MCV 91.1 90.3     Recent Labs     10/28/21  0311 10/27/21  1140   Sodium 131* 137   Potassium 3.8 4.1   Chloride 97* 98*   CO2 25 25   BUN 14.0 15.0   Creatinine 0.7 0.7   Glucose 80 171*   Calcium 8.6 9.1   Magnesium 1.8  --      Recent Labs     10/27/21  1140   AST (SGOT) 24   ALT 28   Alkaline Phosphatase 83   Protein, Total 6.4   Albumin 2.7*     No results for input(s): PTT, PT, INR in the last 72  hours.  Imaging personally reviewed.    Safety Checklist:     DVT prophylaxis:  CHEST guideline (See page [id number]) Chemical and Mechanical   Foley:  [facility] R[facility] Foley protocol Present and continue: Indication: Urinary retention   IVs:  Peripheral IV   PT/OT: Ordered   Daily CBC & or Chem ordered:  SHM/ABIM guidelines (see #5) Yes, due to clinical and lab instability   Reference for approximate charges of common labs: CBC auto diff - $76   BMP - $99   Mg - $79  Disposition:   Today's date: 10/28/2021   Admit Date: 10/27/2021 11:09 AM  Anticipated medical stability for discharge:Yellow - maybe tomorrow  Service status: Observation: Due  to oxygen usage, weaning, and ID consult  Reason for ongoing hospitalization: oxygen requirements  Anticipated discharge needs: transport coordination to facility to [place]   I have discussed with Attending: Jerral Ralph, MD  Signed by: Leron Croak, NP  Adult Observation Unit Nurse Practitioner  [phone] or [phone] on NT6     Attending Attestation:     I have seen and personally examined the patient.  I agree with the history, exam, assessment, and management plans as documented by NP Ziobro.  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 Admitted 10/27/21 for chest pain.     Labs on admission notable for: WBC 16.47, H/H 10.7/34.5, negative troponin, abnormal UA from foley (large LE, large blood, TNTC RBC, TNTC WBC). CXR showed cardiomegaly, trace pleural effusions. CTA Chest showed no PE, trace bilateral effusions. EKG showed sinus tachycardia with PACs. Blood cultures and urine cultures were collected. Patient was started on levaquin. Patient was admitted to observation. Procal was checked and not elevated  (0.15). CT T/L spine showed chronic fractures T12, L1, and L2 vertebral bodies with age-indeterminate L3 vertebral body fracture.     Patient reports her biggest complaint is pain along her lower rib cage bilaterally, for which she'd take robaxin if she was at home. She was heading to an already set up apartment in an indept living facility in [place], [place]. All her family (including daughter/NP) are currently in [place].     # Chest pain, suspect MSK (possibly referred pain from vertebral fractures vs related to pleural effusion)  # Abnormal UA - not CAUTI  # Foley catheter, present on admission (per pt, placed a few days ago 2/2 retention)  # Leukocytosis  # Anemia, normocytic   # Thrombocytosis   # History of Cdiff, failed Vancomycin; on fidaxomin PTA  # PMR, on prednisone 5mg  PTA  # History of temporal arteritis  # Fibromyalgia  # MDS s/p bone marrow transplant 1999  # History of CVA  # CKD3  # History of L2 compression fracture s/p vertebroplasty ~08/2021  # HTN  # HLD  # Hypothyroidism  # GERD  # Insomnia, on trazadone PTA    Plan:  - for report of SOB/hypoxia; this is new - possibly splinting/atelectasis from immobilization and chest pain, pleural effusion with mild volume overload  - will trial lasix 20mg  IV x1  - COVID is neg, procal is low  - continue to wean off O2  - f/u PT/OT  - currently hypoxia is barrier to discharge     - for chest pain: suspect this is MSK vs related to bilateral effusions; bilateral flanks without any lesions, 2 lido patches in place; not MI, not PE, not rib fracture; lower on differential but consider PMR flare  - there is no back pain  - continue lido patches  - change robaxin TID PRN to scheduled  - no IV dilaudid (elderly, slightly forgetful on exam)  - echo not needed - canceled   - has valium PRN, has oxycodone PRN  - holding her PTA tramadol    - for abnormal UA (not a urinary tract infection, not CAUTI)  - exchange out foley today  - f/u UCx, Bld  Cx   - ID consult requested by  admitter, discussed with Dr. Allena Katz - UA sample was collected before foley exchanged - recommend monitoring off abx (not a UTI)  - FYI penicillin allergy (got BM transplant from sister, who was allergic to PCN)    - history of Cdiff, failed PO Vanc; on fidaxomin PTA; not having diarrhea now  - continue with fidaxomin (3 more days left)    - for h/o L/Tspine compression fractures - no back pain, no brace at bedside  - for PMR - continue PTA prednisone   - for HTN - continue amlodipine  - for hypothyroidism - continue synthroid   - for insomnia - continue trazodone  - for GERD - continue protonix (on prilosec AND protonix PTA)  - for HLD - continue pravastatin    - will need CM help to get medical transport arranged to move patient from [place] to final destination in [place]  Full Code      Disposition:     Today's date: 10/27/2021   Admit Date: 10/27/2021 11:09 AM  Clinical Milestones: wean off O2  Anticipated discharge needs: transport to [place], [place]

## 2021-10-28 NOTE — Progress Notes (Addendum)
Adult Observation Progress Note          End of Shift Update 1808: Patient now on room air 92%SPO2 patient not complaining of any SOB. Patient continues to complain of 8 out of 10 pain bilaterally underneath her breast. Patient seems to be in no distress resting comfortably watching TV. Patient has also refused to have an abdominal binder placed, states she has several at home and she never uses them.     Shift Note:    General: Patient resting comfortably in bed, continues to c/o of pain that is located bilaterally below her breasts and r/t to her back. Patient in no distress. Orthostatic positive this morning vitals in as a separate note, MD Joni Fears and NP Alata aware. Patient has history of being orthostatic positive midodrine added. Patient c/o SOB this morning, while on room air, her oxygen saturation dropped down to 85% had to be placed on 2 liters NC. Foley exchanged out. No BM today, no signs of bleeding no diarrhea.   Neuro: AAOx4 but forgetful and anxious   Cardio: NSR on tele  Resp: 2 liters NC required, patient oxygen saturation rate dropped on room air this morning refer to OS template note below   Integ: Blanchable redness to sacrum   MSK: 1 person contact guard, orthostatic chronically, ted hose on  GI: No BM   GU: Indwelling foley exchanged today   IV Access: c/d/i     Events during shift: Indwelling foley was exchanged by this RN and RN Jas     BM during shift: No      Pending Orders:  -Wean off oxygen   -Pain control     Discharge Plan: TBD    Social/Family Visits: None; patient has personal wheelchair, luggage, several bags at bedside     POC update: Patient             PULSE OXIMETRY TESTING:  Document patient's oxygen at rest on room air:  87% on room air, at rest (no ranges please)  93% on oxygen, at 2 LPM via NC  IF 88% OR BELOW on room air, STOP HERE,  IF NOT ambulate patient on room air with exertion and document below:  85% on room air, with exertion (must be 88% or below)  95% on oxygen, with  exertion, at 2 LPM via NC    All three tests must be during the same session.      Please copy and paste this template,  document saturations in appropriate places, (NO RANGES PLEASE)  in a PROGRESS NOTE.      Testing to qualify for home oxygen must be no earlier than 48 hours prior to discharge, or it will need to be repeated.                        132/73 99 lying     85/50 110 sitting

## 2021-10-29 LAB — BASIC METABOLIC PANEL
Anion Gap: 9 (ref 5.0–15.0)
BUN: 11 mg/dL (ref 7.0–21.0)
CO2: 28 mEq/L (ref 17–29)
Calcium: 9 mg/dL (ref 7.9–10.2)
Chloride: 97 mEq/L — ABNORMAL LOW (ref 99–111)
Creatinine: 0.7 mg/dL (ref 0.4–1.0)
Glucose: 102 mg/dL — ABNORMAL HIGH (ref 70–100)
Potassium: 3.9 mEq/L (ref 3.5–5.3)
Sodium: 134 mEq/L — ABNORMAL LOW (ref 135–145)

## 2021-10-29 LAB — URINALYSIS REFLEX TO MICROSCOPIC EXAM - REFLEX TO CULTURE
Bilirubin, UA: NEGATIVE
Glucose, UA: NEGATIVE
Ketones UA: NEGATIVE
Nitrite, UA: NEGATIVE
Specific Gravity UA: 1.019 (ref 1.001–1.035)
Urine pH: 6.5 (ref 5.0–8.0)
Urobilinogen, UA: NORMAL mg/dL (ref 0.2–2.0)

## 2021-10-29 LAB — CBC AND DIFFERENTIAL
Absolute NRBC: 0 10*3/uL (ref 0.00–0.00)
Basophils Absolute Automated: 0.07 10*3/uL (ref 0.00–0.08)
Basophils Automated: 0.4 %
Eosinophils Absolute Automated: 0.55 10*3/uL — ABNORMAL HIGH (ref 0.00–0.44)
Eosinophils Automated: 3 %
Hematocrit: 27.5 % — ABNORMAL LOW (ref 34.7–43.7)
Hgb: 9 g/dL — ABNORMAL LOW (ref 11.4–14.8)
Immature Granulocytes Absolute: 0.11 10*3/uL — ABNORMAL HIGH (ref 0.00–0.07)
Immature Granulocytes: 0.6 %
Lymphocytes Absolute Automated: 1.36 10*3/uL (ref 0.42–3.22)
Lymphocytes Automated: 7.4 %
MCH: 29.3 pg (ref 25.1–33.5)
MCHC: 32.7 g/dL (ref 31.5–35.8)
MCV: 89.6 fL (ref 78.0–96.0)
MPV: 10.7 fL (ref 8.9–12.5)
Monocytes Absolute Automated: 1.67 10*3/uL — ABNORMAL HIGH (ref 0.21–0.85)
Monocytes: 9.1 %
Neutrophils Absolute: 14.52 10*3/uL — ABNORMAL HIGH (ref 1.10–6.33)
Neutrophils: 79.5 %
Nucleated RBC: 0 /100 WBC (ref 0.0–0.0)
Platelets: 437 10*3/uL — ABNORMAL HIGH (ref 142–346)
RBC: 3.07 10*6/uL — ABNORMAL LOW (ref 3.90–5.10)
RDW: 15 % (ref 11–15)
WBC: 18.28 10*3/uL — ABNORMAL HIGH (ref 3.10–9.50)

## 2021-10-29 LAB — TSH: TSH: 2.66 u[IU]/mL (ref 0.35–4.94)

## 2021-10-29 LAB — MAGNESIUM: Magnesium: 1.8 mg/dL (ref 1.6–2.6)

## 2021-10-29 LAB — GFR: EGFR: >60

## 2021-10-29 MED ORDER — FUROSEMIDE 10 MG/ML IJ SOLN
20.0000 mg | Freq: Once | INTRAMUSCULAR | Status: AC
Start: 2021-10-29 — End: 2021-10-29
  Administered 2021-10-29: 20 mg via INTRAVENOUS
  Filled 2021-10-29: qty 4

## 2021-10-29 MED ORDER — KETOROLAC TROMETHAMINE 30 MG/ML IJ SOLN
15.0000 mg | Freq: Once | INTRAMUSCULAR | Status: AC
Start: 2021-10-29 — End: 2021-10-29
  Administered 2021-10-29: 15 mg via INTRAVENOUS
  Filled 2021-10-29: qty 1

## 2021-10-29 MED ORDER — PREDNISONE 5 MG PO TABS
5.0000 mg | ORAL_TABLET | Freq: Every morning | ORAL | Status: DC
Start: 2021-11-03 — End: 2021-11-02

## 2021-10-29 MED ORDER — FIDAXOMICIN 200 MG PO TABS
200.0000 mg | ORAL_TABLET | Freq: Two times a day (BID) | ORAL | Status: AC
Start: 2021-10-29 — End: 2021-10-30
  Administered 2021-10-29 – 2021-10-30 (×3): 200 mg via ORAL
  Filled 2021-10-29 (×3): qty 1

## 2021-10-29 MED ORDER — PREDNISONE 20 MG PO TABS
20.0000 mg | ORAL_TABLET | Freq: Every morning | ORAL | Status: AC
Start: 2021-10-29 — End: 2021-11-02
  Administered 2021-10-29 – 2021-11-02 (×5): 20 mg via ORAL
  Filled 2021-10-29 (×5): qty 1

## 2021-10-29 NOTE — Discharge Instr - AVS First Page (Addendum)
Reason for your Hospital Admission:  ***      Instructions for after your discharge:  ***

## 2021-10-29 NOTE — Progress Notes (Signed)
Office: [phone]  E[facility] GroupChat: "[facility])"    Date Time: 10/29/21 @NOW   Patient Name: Jessica Mcbride, Jessica Mcbride      Problem List:    Acute Problem List:      Admitted on 10/27/2021 for chest pain.  H/o C diff (unclear where). Apparently was treated with PO vanc with still positive DNA PCR so is now finishing a course a Fidaxomicin.      Recent C diff infection.     H/o recurrent UTI.  -- UA (11/19) - large LE; WBC = TNTC.     Leukocytosis.  Thrombocytosis.     Chronic Conditions:  PMR  Temoporal arteritis  MDS s/p BMT in 1999  HTN  CAD  H/o CVA  Hypothyroidism  HLD  Diverticulosis  CKD stage 3  H/o vertebral fractures  GERD  Insomnia  Calcified granuloma         Interval Events/Subjective:     Foley exchanged  No diarrhea  Afebrile  No dysuria  WBC 18     Antimicrobials:   #3  IV Levofloxacin 500 mg daily 11/19 -  Estimated Creatinine Clearance: 61.6 mL/min (based on SCr of 0.7 mg/dL).      Assessment:     87 y.o. female with multiple medical problems including MDS s/p BMT in 1999 who presents to the hospital on 10/27/2021 with chest pain.     On admission, she was afebrile with a leukocytosis. She was requiring 2L NC. CTA chest with no PE and trace bilateral pleural effusions with a calcified granuloma is present in the right middle lobe     CT T and L spine with no evidence of fractures. She had known chronic compression fractures of T12, L1, and L2.     Of note, she is being treated for C diff diarrhea. Apparently still positive after a course of PO vanc so on Fidaxomicin now with 3 days left.      Started on levofloxacin on admission due to pyuria in the setting or a chronic foley for urinary retention.     Per patient, she has lived in [place] for the last 6 years in a rehab where she recently got a foley placed for urinary retention and treated for C diff colitis. She was on her way to [place] to live with her daughters when she started developed nausea and chest pain so she  was brought here.      She attests to no overt diarrhea and no urinary complaints    Foley exchanged  Repeat UA again mild pyuria    Plan:   Recommend stopping levofloxacin  Trend WBC  Finish her fidaxomicin   Monitor diarrhea     Monitoring clinical response and untoward effects of high risk/IV antimicrobials        Lines:     piv      Review of Systems:   General ROS: negative for - chills, fevers, night sweats, weight loss   HEENT: negative for - blurry vision, sore throat, thrush   Respiratory ROS: negative for cough, SOB  Cardiovascular ROS: negative for - chest pain, palpitations   Gastrointestinal ROS: negative for - abdominal pain, nausea, vomiting, diarrhea  Genito-Urinary ROS: negative for - dysuria, urinary frequency/urgency   Musculoskeletal ROS: negative for - joint pain, joint stiffness or muscle pain   Dermatological ROS: negative for - rash and skin lesion changes   Neurological ROS: negative for - confusion, headache, dizziness  Hematological ROS: negative for -  bruising, bleeding   Psychological ROS: negative for - changes in mood    Physical Exam:     BP (!) 80/34   Pulse 94   Temp 99.3 F (37.4 C) (Oral)   Resp 18   Ht 1.676 m (5\' 6" )   Wt 77.1 kg (170 lb)   SpO2 96%   BMI 27.44 kg/m       General Appearance: alert and appropriate, non-toxic  Neuro: alert, oriented, normal speech, normal attention and cognition  HEENT: no scleral icterus  Neck: supple   Abdomen: soft, non-tender  Extremities: no pedal edema  Skin: no rash  Psych: normal mood and affect      Family History:   History reviewed. No pertinent family history.    Social History:     Social History     Socioeconomic History    Marital status: Widowed     Spouse name: Not on file    Number of children: Not on file    Years of education: Not on file    Highest education level: Not on file   Occupational History    Not on file   Tobacco Use    Smoking status: Never    Smokeless tobacco: Never   Vaping Use    Vaping Use: Never used    Substance and Sexual Activity    Alcohol use: Never    Drug use: Never    Sexual activity: Not on file   Other Topics Concern    Not on file   Social History Narrative    Not on file     Social Determinants of Health     Financial Resource Strain: Not on file   Food Insecurity: Not on file   Transportation Needs: Not on file   Physical Activity: Not on file   Stress: Not on file   Social Connections: Not on file   Intimate Partner Violence: Not on file   Housing Stability: Not on file       Allergies:     Allergies   Allergen Reactions    Penicillins Anaphylaxis       Labs:     Lab Results   Component Value Date    WBC 18.28 (H) 10/29/2021    HGB 9.0 (L) 10/29/2021    HCT 27.5 (L) 10/29/2021    MCV 89.6 10/29/2021    PLT 437 (H) 10/29/2021     Lab Results   Component Value Date    CREAT 0.7 10/29/2021     Lab Results   Component Value Date    ALT 28 10/27/2021    AST 24 10/27/2021    ALKPHOS 83 10/27/2021    BILITOTAL 0.4 10/27/2021     No results found for: LACTATE    Microbiology:     Microbiology Results (last 15 days)       Procedure Component Value Units Date/Time    Urine culture [[id number]] Collected: 10/29/21 1205    Order Status: No result Specimen: Urine Updated: 10/29/21 1316    Clostridium difficile toxin B PCR [[id number]]     Order Status: Canceled Specimen: Stool     COVID-19 (SARS-CoV-2) only ([facility]) asymptomatic admission - Hospitals [[id number]] Collected: 10/28/21 0948    Order Status: Completed Specimen: Nasopharyngeal Updated: 10/28/21 1023     Purpose of COVID testing Screening     SARS-CoV-2 Specimen Source Nasal Swab     SARS CoV 2 Overall Result Not Detected     Comment: __________________________________________________  -  A result of "Detected" indicates POSITIVE for the    presence of SARS CoV-2 RNA  -A result of "Not Detected" indicates NEGATIVE for the    presence of SARS CoV-2 RNA  __________________________________________________________  Test performed using the Roche cobas [facility]  SARS-CoV-2 assay. This assay is  only for use under the Food and Drug Administration's Emergency Use  Authorization. This is a real-time RT-PCR assay for the qualitative  detection of SARS-CoV-2 RNA. Viral nucleic acids may persist in vivo,  independent of viability. Detection of viral nucleic acid does not imply the  presence of infectious virus, or that virus nucleic acid is the cause of  clinical symptoms. Negative results do not preclude SARS-CoV-2 infection and  should not be used as the sole basis for diagnosis, treatment or other  patient management decisions. Negative results must be combined with  clinical observations, patient history, and/or epidemiological information.  Invalid results may be due to inhibiting substances in the specimen and  recollection should occur. Please see Fact Sheets for patients and providers  located:  [facility]         Narrative:      o Collect and clearly label specimen type:  o PREFERRED-Upper respiratory specimen: One Nasal Swab in  Transport Media.  o Hand deliver to laboratory ASAP  Indication for testing->Extended care facility admission to  semi private room  Screening    Culture Blood Aerobic and Anaerobic [[id number]] Collected: 10/27/21 1621    Order Status: Completed Specimen: Blood, Venipuncture Updated: 10/28/21 1821    Narrative:      The order will result in two separate 8-4ml bottles  Please do NOT order repeat blood cultures if one has been  drawn within the last 48 hours  UNLESS concerned for  endocarditis  AVOID BLOOD CULTURE DRAWS FROM CENTRAL LINE IF POSSIBLE  Indications:->Pneumonia  ORDER#: [id number]                                    ORDERED BY: CHUA, ALEX  SOURCE: Blood, Venipuncture SP R                     COLLECTED:  10/27/21 16:21  ANTIBIOTICS AT COLL.:                                RECEIVED :  10/27/21 17:39  Culture Blood Aerobic and Anaerobic        PRELIM      10/28/21 18:21  10/28/21   No Growth after 1  day/s of incubation.      Culture Blood Aerobic and Anaerobic [[id number]] Collected: 10/27/21 1621    Order Status: Completed Specimen: Blood, Venipuncture Updated: 10/28/21 1821    Narrative:      The order will result in two separate 8-59ml bottles  Please do NOT order repeat blood cultures if one has been  drawn within the last 48 hours  UNLESS concerned for  endocarditis  AVOID BLOOD CULTURE DRAWS FROM CENTRAL LINE IF POSSIBLE  Indications:->Pneumonia  ORDER#: [id number]                                    ORDERED BY: CHUA, ALEX  SOURCE: Blood, Venipuncture  COLLECTED:  10/27/21 16:21  ANTIBIOTICS AT COLL.:                                RECEIVED :  10/27/21 17:39  Culture Blood Aerobic and Anaerobic        PRELIM      10/28/21 18:21  10/28/21   No Growth after 1 day/s of incubation.      Urine culture [[id number]] Collected: 10/27/21 1409    Order Status: Completed Specimen: Bladder Updated: 10/28/21 1642    Narrative:      ORDER#: [id number]                                    ORDERED BY: Derrell Lolling, MARIO  SOURCE: Urine                                        COLLECTED:  10/27/21 14:09  ANTIBIOTICS AT COLL.:                                RECEIVED :  10/27/21 17:53  Culture Urine                              FINAL       10/28/21 16:41  10/28/21   >100,000 CFU/ML of multiple bacterial morphotypes present.             Possible contamination, appropriate recollection is             requested if clinically indicated.              Rads:   No results found.    Signed by: Claiborne Rigg, MD, MD

## 2021-10-29 NOTE — Progress Notes (Cosign Needed)
MEDICINE PROGRESS NOTE    Date Time: 10/29/21 6:48 PM  Patient Name: Jessica Mcbride  Attending Physician: Roxana Hires, MD    CC: Left-Sided Chest Pain and SOB    Assessment:   Hospital Course  Jessica Mcbride is a 87 y.o. female with a PMHx of PMR, temporal arteritis, myelodysplastic syndrome s/p BMT 1999, chronic anemia, HTN, CAD, hx of CVA, hypothyroidism, HDL, diverticulosis, CKD 3B, chronic chest pain, chronic orthostatic hypotension, urinary retention (PTA foley cath), C. Diff( failed oral vancomycin-on fidaxomicin), recent UTI, vertebral fractures, L2 kyphoplasty 9/22, admitted 10/27/2021 to observation for  who presents to the hosptial with left-sided chest pain, SOB and hypoxia    . Arrived to ED with BP 105/60, HR 95, RR 21, 95% on room air, afebrile. EKG shows sinus tachycardia, HR 111. Labs on admission were notable for WBC 16.74, H&H 10.7/34.5, PLT 525. UA noted large leukocytes, 1+ protein, large blood, RBC & WBC TNTC. BC and urine culture collected.  CRP 14, ESR 84.  CXR showed cardiomegaly, trace effusion and right perihilar opacities (Procalcitonin  0.15). CT T and L spine shows chronic compression fx of T12. CTA chest done d/t persistent tachycardiac in ED, no evidence of PE.     Chest pain more consistent with MSK pain and PMR flare. Started on multimodal pain medication. PTA Foley exchanged on 10/28/21.  Additionally ordered levofloxacin for UTI; however D/C'd after 3 doses per ID recommendations, foley exchange and repeat UA.  IV lasix  also given for suspected mild fluid overload-with decreased SOB and WOB. Prednisone dose increased to 20 mg daily x 5 days for suspected PMR flare. Will also attempt to wean supplental O2 as tolerated.     Interval History:    10/28/21:  Decrease O2 use, IV lasix, scheduled zofran and robaxin, ID consult    10/29/21: Attempt to wean O2. IV Lasix 20 mg x 1 given for WOB, hypoxia and  trace bilateral pleural effusions. Multimodal pain control and  Prednisone burst ordered for possible polymyalgia rheumatica flare. 3 day Levoquin course completed. Continue Dificid for positive C diff PCR (diarrhea imporved). No additional antibiotics at this time. Follow-up on blood and urine cultures.      Plan:   # Chest pain  (likley 2/2 Pleurisy vs Musculoskeletal pain from Splinting  #Acute Hypoxemic Respiratory Failure (2/2 pain as above)  #Chronic compression fractures s/p kyphoplasty T12 - L2  -Wean supplemental O2 as tolerated; Keep SpO2 > 90%  -Encourage IS  -Additional V Lasix 20 mg given for pleural ffusion/WOB  -Procal 0.15, unlikely PNA.  - Continue lidocaine patch scheduled  - Continue robaxin scheduled   - continue scheduled tylenol   - Continue PRN Oxycodone   -Continue PRN Valium    #CAUTI  # Chronic Urinary Retention rwith PTA Foley  -ID consulted and recommendations appreciated  - Foley exchanged today  -S/p 3 days Levaquin; no further antibiotics per ID  -Follow-up urine and blood cultures  -Repeat UA s/p fofley echange showed decreased LE, protein, RBC and WBC     #History of C Diff, (Failed oral Vancomycin)   - ID consult, appreciate   - Continue fidaxomicin 11/20  - C-Diff repeat PCR positive     #Leukocytosis   - possibly d/t CAUTI, Cdiff or PMR flare  - Blood cultures pending   - CBC in AM     #Chronic Orthostatic Hypotension   - Continue Midodrine PRN for SBP < 90 at rest for now, consider continuing  on discharge  - Abdominal binder ordered  - TED hose ordered     #Chronic Normocytic  Anemia (Chronic, Stable)  - H/H 8.9/28.6 today, no signs of acute bleeding   - Repeat CBC in the morning   - Transfuse for hgb <7     #Polymyalgia Rheumatica (Flare likely)  #History of Temporal Arteritis   #History of Fbromyalgia   - Prednisone dose increased to 20 mg x 5 days  - Continue arava  - ESR 84, CRP 14      #MDS (s/p BMT 1999)- Noted    #HTN  -continue amlodipine and inderal      #Hypothyroidism  - Continue Synthroid  -TSH 2.66     #HDL  #hx of CVA  -  continue pravastatin      #Insomnia  - Continue trazodone QHS      #CKD III  - creatinine 0.7  - renally dose meds, avoid nephrotoxic agents  - BMP in AM     #Hyponatremia  (Mild, Improved, Resolving)  - Recheck BMP in AM   22  #GERD  - Continue Protonix      #Nausea (Resolved)  -Continue Zofran    Expected [place]: Discharge likely in 1-2 days pending clinical course  DVT ppx: LOVENOX  Interpreter: no, not indicated  Code Status: FULL CODE  Diet: Consistant Carbohydrate Diet    Please see attending note that follows this mid-level encounter note.   Review of Systems:   Review of Systems - Negative except Left-sided chest pain and SOB  ROS  Physical Exam:   Physical Exam  Vitals reviewed.   Constitutional:       Appearance: Normal appearance.   HENT:      Head: Normocephalic and atraumatic.      Nose: Nose normal.      Mouth/Throat:      Mouth: Mucous membranes are dry.      Pharynx: Oropharynx is clear.   Eyes:      Pupils: Pupils are equal, round, and reactive to light.   Cardiovascular:      Rate and Rhythm: Normal rate and regular rhythm.   Pulmonary:      Effort: Pulmonary effort is normal.      Breath sounds: Normal breath sounds.   Abdominal:      General: Abdomen is flat. Bowel sounds are normal.   Musculoskeletal:      Cervical back: Normal range of motion.   Skin:     General: Skin is warm and dry.      Capillary Refill: Capillary refill takes less than 2 seconds.   Neurological:      General: No focal deficit present.      Mental Status: She is alert.   Psychiatric:         Behavior: Behavior normal.         Judgment: Judgment normal.     VITAL SIGNS PHYSICAL EXAM   Temp:  [98.6 F (37 C)-99.3 F (37.4 C)] 99.3 F (37.4 C)  Heart Rate:  [86-105] 102  Resp Rate:  [14-18] 18  BP: (80-149)/(34-75) 91/50  Blood Glucose:    Telemetry: NSR      Intake/Output Summary (Last 24 hours) at 10/29/2021 1848  Last data filed at 10/29/2021 1204  Gross per 24 hour   Intake --   Output 1600 ml   Net -1600 ml    Physical  Exam  General: awake, alert X 3  Cardiovascular: regular rate and rhythm, no  murmurs, rubs or gallops  Lungs: clear to auscultation bilaterally, without wheezing, rhonchi, or rales  Abdomen: soft, non-tender, non-distended; no palpable masses,  normoactive bowel sounds  Extremities: no edema  Other: n/a        Meds:     Current Facility-Administered Medications   Medication Dose Route Frequency    acetaminophen  1,000 mg Oral Q8H    enoxaparin  40 mg Subcutaneous Daily    fidaxomicin  200 mg Oral BID    folic acid  1 mg Oral Daily    leflunomide  20 mg Oral Daily    levothyroxine  125 mcg Oral Daily at 0600    lidocaine  1 patch Transdermal Q24H    lidocaine  2 patch Transdermal Q24H    methocarbamol  500 mg Oral TID    ondansetron  4 mg Intravenous Q8H    pantoprazole  40 mg Oral QAM AC    pravastatin  20 mg Oral Daily    predniSONE  20 mg Oral QAM W/BREAKFAST    Followed by    Melene Muller ON 11/03/2021] predniSONE  5 mg Oral QAM W/BREAKFAST    propranolol  10 mg Oral Daily    traZODone  25 mg Oral QHS     Labs:     Recent Labs     10/29/21  0553 10/28/21  0311   WBC 18.28* 16.59*   Hgb 9.0* 8.9*   Hematocrit 27.5* 28.6*   Platelets 437* 438*   MCV 89.6 91.1     Recent Labs     10/29/21  0553 10/28/21  0311   Sodium 134* 131*   Potassium 3.9 3.8   Chloride 97* 97*   CO2 28 25   BUN 11.0 14.0   Creatinine 0.7 0.7   Glucose 102* 80   Calcium 9.0 8.6   Magnesium 1.8 1.8     Recent Labs     10/27/21  1140   AST (SGOT) 24   ALT 28   Alkaline Phosphatase 83   Protein, Total 6.4   Albumin 2.7*     No results for input(s): PTT, PT, INR in the last 72 hours.  Imaging personally reviewed.    Safety Checklist:     DVT prophylaxis:  CHEST guideline (See page [id number]) Chemical and Mechanical   Foley:  [place] Rn Foley protocol Present and continue: Indication: Urinary retention   IVs:  Peripheral IV   PT/OT: Not needed   Daily CBC & or Chem ordered:  SHM/ABIM guidelines (see #5) Yes, due to clinical and lab instability   Reference for  approximate charges of common labs: CBC auto diff - $76  BMP - $99  Mg - $79  Disposition:   Today's date: 10/29/2021  Admit Date: 10/27/2021 11:09 AM  Anticipated medical stability for discharge:Yellow - maybe tomorrow  Service status: Inpatient non-IMC Status: risk of progressive disease  Reason for ongoing hospitalization: Pain control, Hypoxia  Anticipated discharge needs: TBD  I have discussed with Attending: Roxana Hires, MD  Signed by: Leeanne Mannan, NP  Adult Observation Unit Nurse Practitioner  [phone] or [phone] on NT6   I have Reviewed the interval history, images and pertinent test results and personally examined the patient and confirmed the major physical findings of the preceding mid-levels note. Agree with above

## 2021-10-29 NOTE — OT Eval Note (Addendum)
Occupational Therapy Eval Jessica Maes [place]        Post Acute Care Therapy Recommendations:     Discharge Recommendations:  ALF, Home with home health OT   D/C Milestones: BP management/increased activity tolerance Anticipate achievement in N/A sessions     DME needs IF patient is discharging home: Shower chair (patient owns RW and wheelchair)    Therapy discharge recommendations may change with patient status.  Please refer to most recent note for up-to-date recommendations.    Assessment:   Significant Findings: +orthostatics, reported below    Supine: 113/63  Sitting EOB: 93/58  Standing: 80/34  BP recovered once back in bed    Jessica Mcbride is a 87 y.o. female admitted 10/27/2021.  Patient presents with decreased activity tolerance and functional mobility, required SBA for supine to sit, CGA for sit to stand, further mobility limited by dizziness/lightheadedness. Patient receives assist with ADLs at baseline, planning for transfer to ALF in [place]. Patient is performing below functional baseline and will benefit from acute OT services to maximize independence and safety.      Therapy Diagnosis: Impaired ADLs and functional mobility     Rehabilitation Potential:   good for set goals      Treatment Activities: OT evaluation  Educated the patient to role of occupational therapy, plan of care, goals of therapy and safety with mobility and ADLs.    Plan:   OT Frequency Recommended: 3-4x/wk     ADL training, functional transfer training, modified bed mobility training, education on energy conservation/fall prevention techniques, compensatory education, UE strengthening, endurance training, relaxation/pain management techniques, education on medical equipment beneficial for increasing ADL performance and safety in home environment.     Risks/benefits/POC discussed with patient     Unit: [facility] TOWER 6  Bed: F631/F631.01         Precautions and Contraindications:   Precautions  Weight  Bearing Status: no restrictions  Other Precautions: Falls, monitor BP    Consult received for Jessica Mcbride for OT Evaluation and Treatment.  Patient's medical condition is appropriate for Occupational Therapy intervention at this time.    Admitting Diagnosis: Bacterial urinary infection [N39.0, A49.9]      History of Present Illness:    Jessica Mcbride is a 87 y.o. female admitted on 10/27/2021 with "PMH of PMR, temporal arteritis, myelodysplastic syndrome s/p BMT 1999, chronic anemia, HTN, CAD, Hx of CVA, hypothyroidism, HLD, diverticulosis, CKD 3B, chronic chest pain, orthostatic hypotension, urinary retention, with recent C. Diff infection failed oral vancomycin, recurrent UTIs, and vertebral fractures, who presents to the hospital with chest spasm pains, suspect non-cardiac in etiology and shortness of breath" per H&P    Past Medical/Surgical History:  Past Medical History:   Diagnosis Date    Anxiety     C. difficile diarrhea     HLD (hyperlipidemia)     HTN (hypertension)      History reviewed. No pertinent surgical history.    Imaging/Tests/Labs:   CT Angio Chest (PE or trauma protocol)    Result Date: 10/27/2021  1. No pulmonary embolism. 2. Trace bilateral pleural effusions and atelectasis. Bosie Helper, MD  10/27/2021 2:43 PM    CT Thoracic Spine WO Contrast    Result Date: 10/27/2021  1. No findings to suggest an acute thoracic compression fracture. Sequela of previous/likely chronic compression fractures of T12, L1 and L2 with postsurgical changes likely related to prior vertebroplasty. 2. Small pleural effusions with findings of the lungs  better appreciated on the recent CT of the chest. Fredrich Birks, MD  10/27/2021 7:23 PM    CT Lumbar Spine WO Contrast    Result Date: 10/27/2021  1. No findings to suggest an acute/new compression deformity of the lumbar spine, although assessment is limited as no prior baseline imaging is available. Probable chronic compression fractures involving the  T12, L1 and L2 vertebral bodies, with postsurgical changes related likely related to prior vertebroplasty. 2. Mild anterior wedging/slightly diminished height of the L3 vertebral body anteriorly which is age-indeterminate, and may be chronic. Fredrich Birks, MD  10/27/2021 7:16 PM    XR Chest  AP Portable    Result Date: 10/27/2021  Cardiomegaly, trace effusions and probable atelectasis. Streaky right perihilar opacities may represent early pneumonia. Follow-up to resolution is recommended. Bosie Helper, MD  10/27/2021 11:38 AM      Social History:   Prior Level of Function:  Prior level of function: Needs assistance with ADLs, Ambulates with assistive device  Assistive Device: Front wheel walker, Wheelchair (RW short distances, w/c for longer)  Baseline Activity Level: Household ambulation  DME Currently at Home: Environmental consultant, [employer], Biomedical scientist, Manual    Home Living Arrangements:  DME Currently at Home: Environmental consultant, [employer], Wheelchair, Manual  Home Living - Notes / Comments: In transit from [facility] to [place] to ALF and to be closer to family    Subjective: "I need to sit! I am going to faint!"     Patient is agreeable to participation in the therapy session.       Patient goal: to take a nap    Pain Assessment  Pain Assessment: No/denies pain    Objective:        Observation of Patient/Vital Signs:  Patient is in bed with IV access, O2 on 2L, telemetry in place.  Pt wore mask during therapy session:No      Cognitive Status and Neuro Exam:  Cognition/Neuro Status  Arousal/Alertness: Appropriate responses to stimuli  Attention Span: Appears intact  Orientation Level: Oriented to person;Oriented to place;Oriented to situation  Safety Awareness: minimal verbal instruction  Insights: Decreased awareness of deficits;Educated in safety awareness  Motor Planning: intact  Coordination: intact    Neuro Status  Motor Planning: intact  Coordination: intact         Musculoskeletal Examination  Gross ROM  Gross ROM: within functional  limits    Gross Strength  Right Upper Extremity Strength: within functional limits  Left Upper Extremity Strength: within functional limits  Right Lower Extremity Strength: within functional limits  Left Lower Extremity Strength: within functional limits         Tone  Tone: within functional limits    Sensory/Oculomotor Examination  Sensory  Auditory: intact  Tactile - Light Touch: intact  Visual Acuity: wears glasses         Activities of Daily Living  Self-care and Home Management  Eating: Independent (hand to mouth)  Grooming: Stand by Assist;edge of bed;wash/dry face  Bathing: Stand by Assist;edge of bed  UB Dressing: Supervision  LB Dressing: Don/doff R sock;Don/doff L sock;Moderate Assist  Toileting: Minimal Assist;standing;perineal hygiene (simulated)  Functional Transfers: Stand by Assist;Contact Guard Assist    Functional Mobility:  Mobility and Transfers  Supine to Sit: Stand by Assist  Sit to Supine: Stand by Assist  Sit to Stand: Contact Guard Assist  Functional Mobility Deferred (Comment): Deferred, orthostatic and symptomatic     PMP Activity: Step 4 - Dangle at Bedside  Balance  Balance  Static Sitting Balance: good  Dyanamic Sitting Balance: good  Static Standing Balance: good  Dynamic Standing Balance: fair    Participation and Activity Tolerance  Participation and Endurance  Participation Effort: good  Endurance: Tolerates 10 - 20 min exercise with multiple rests    Patient left with call bell within reach, all needs met, SCDs off, fall mat on, bed alarm on, chair alarm off, RN and MD in room working with patient and all questions answered. RN notified of session outcome and patient response.     Goals:  Time For Goal Achievement: 3 visits  ADL Goals  Patient will groom self: Supervision (sitting at sink)  Patient will toilet: Stand by Assist  Mobility and Transfer Goals  Pt will perform functional transfers: Stand by Assist (RW short distances)        Executive Fucntion Goals  Pt will follow  energy conservation techniques: with stand by assist, to increase ability to complete ADLs                PPE worn during session: procedural mask, gown, and gloves    Tech present: No   PPE worn by tech: N/A    Time of treatment:   OT Received On: 10/29/21  Start Time: 1105  Stop Time: 1130  Time Calculation (min): 25 min    Juanetta Beets, OTR/L  Pager [phone]

## 2021-10-29 NOTE — Plan of Care (Addendum)
Attending Attestation:   I have reviewed the interval history, images and pertinent test results and personally examined the patient, confirmed the major physical findings and discussed the assessment/plan w/ Roe Rutherford, NP. Full note to follow.     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 Admitted 10/27/21 for chest pain. Suspect MSK etiology +/- fibromyalgia flare, ddx pleurisy (small pleural effusions + atelectasis on chest imaging). CTA neg for PE or PNA. ACS ruled out. COVID/flu neg. PCT neg. CT T/L spine showed chronic fractures T12, L1, and L2 vertebral bodies with age-indeterminate L3 vertebral body fracture. Multimodal pain regimen administered - she appears comfortable this AM but states pain is 10/10 in severity and nothing his helping. WBC rising to 18.2 otday.    She does desat to 86-89% on RA at rest. Suspect this is atelectasis 2/2 splinting.     Admission UA c/w UTI/CAUTI (?pyelo considered but location of pain atypical), UCx multiple bacterial morphotypes and started on levaquin. Foley exchanged, UA still positive. Was on Dificid for c.diff dx PTA (reportedly failed PO Vanc). ID consulted on admission - advised holding Levaquin.      # Chest pain - MSK vs pleurisy  # Acute hypoxic respiratory failure - requiring 2L NC  # Abnormal UA - not CAUTI per ID  # Foley catheter, present on admission (per pt, placed a few days ago 2/2 retention, exchanged 11/20)  # Leukocytosis  # Anemia, normocytic   # Thrombocytosis   # History of Cdiff, failed Vancomycin; on fidaxomin PTA  # PMR, on prednisone 5mg  PTA  # History of temporal arteritis  # Fibromyalgia  # Anxiety  # MDS s/p bone marrow transplant 1999  # History of CVA  # CKD3  # History of L2 compression fracture s/p vertebroplasty ~08/2021  # HTN  # HLD  # Hypothyroidism  #  GERD  # Insomnia, on trazadone PTA  # Orthostatic hypotension, has been prescribed midodrine in the past     Plan:  - Re: hypoxia will give additional lasix 20mg  IV x 1. Incentive spirometer. Pain control.   - Trial burst of prednisone for PMR flare related pain (ESR/CRP high). Suggest 20mg  x 5 days then back to 5mg  daily. Toradol IV x 1 today.   - Cont robaxin, lidocaine patches, oxycodone prn (holding PTA tramadol)  - Med list report recent scripts ordered available in My Chart - multiple diazepam scripts in last couple months, anxiety may be playing a role  - Hold further antibiotics, s/p 3 days levofloxacin. F/u UCx from foley after exchange.  - Monitor for diarrhea given rising WBC, complete course of Dificid (extend stop date until tomorrow as received abx today)  - Orthostasis noted, precautions for now. Hold amlodipine as BP is soft.   - Cont remainder of home meds as ordered      Roxana Hires, MD

## 2021-10-29 NOTE — Progress Notes (Signed)
10/29/21 1100   CM Review   Acknowledgment of Outpatient/Observation Observation letter given       CMA made an outreach to patient Jessica Mcbride and left a voice message regarding patient hospital observation status. CMA also informed patient and/or patient family that the observation letter will be mailed out to their home address.      Comer Locket  Case Management Assistant  Case Management Department  [facility]  [email or web address]

## 2021-10-29 NOTE — Progress Notes (Signed)
Adult Observation Progress Note    Shift Note:    General: Patient VSS , in no apparent distress at this time. Contact special isolation in place. Pt c/o of pain underneath her left breast, tylenol, robaxin administered, pt resting in bed.    Neuro: Patient is AOx4. Patient denies numbness or tingling. Perrla.   Cardio: NSR on telemetry, verified with [facility]. S1, S2 auscultated.   Resp: Patient on 2L NC with lung sounds clear bilaterally on auscultation. Attempted to wean of fO2, pt oxygen sat dropped to 86%, pt was put back on 2L NC   Integ: Skin intact, no wounds or edema noted.   MSK: At baseline patient ambulates: with a walker or wheelchair . low/mod/high: High falls risk. Pt has not ambulated this shift, due to weakness.   GI: Bowel sounds auscultated all 4 quadrants. L/b/m 11/19  GU: Patient is continent of urine. Foley in place urinary retention.   IV Access: IV: 22g  in: R hand.       BM during shift: YES/NO: no    Pending Orders: IV Abx, wean off oxygen, pain control, blood cx result, Foley,     Discharge Plan: TBD,  pending medical clearance    Social/Family Visits: no    POC update: pt updated with POC during bedside report      Vitals:    10/28/21 1553 10/28/21 1758 10/28/21 2028 10/28/21 2100   BP: 136/72  118/67    Pulse: 77 93 95    Resp: 16  14    Temp: 98.1 F (36.7 C)  98.6 F (37 C)    TempSrc: Oral  Oral    SpO2: 97% 92% (!) 86% 95%   Weight:       Height:           Patient Lines/Drains/Airways Status       Active Lines, Drains and Airways       Name Placement date Placement time Site Days    Peripheral IV 10/28/21 22 G Posterior;Right Hand 10/28/21  1106  Hand  less than 1    Urethral Catheter Latex 18 Fr. 10/28/21  1200  Latex  less than 1

## 2021-10-29 NOTE — Progress Notes (Incomplete)
Adult Observation Progress Note    Shift Note:    General: Patient VSS ***, in no apparent distress at this time.  Neuro: Patient is AOx***. Patient denies numbness or tingling. Perrla.   Cardio: *** on telemetry, verified with [facility]. S1, S2 auscultated.   Resp: Patient on *** with lung sounds *** bilaterally on auscultation.   Integ: Skin intact, no wounds or edema noted.   MSK: Patient {:43961:::1} {:43962:::1}. {:43960:::1} falls risk.   GI: Bowel sounds auscultated all 4 quadrants.   GU: Patient is continent of urine.   IV Access: {:43963:::1}  {:43964:::1}.     No significant events or provider communication. Patient *** pain at this time, and is resting comfortably in bed.    BM during shift: {YES/NO:21004:::1}    Pending Orders: ***    Discharge Plan: To ***, pending medical clearance    Social/Family Visits: ***    POC update: pt updated with POC during bedside report      Vitals:    10/29/21 1627 10/29/21 1631 10/29/21 1908 10/29/21 2319   BP: 99/62 91/50 125/74 136/70   Pulse: 92 (!) 102 90 86   Resp:       Temp:   97.2 F (36.2 C) 98.1 F (36.7 C)   TempSrc:   Oral Oral   SpO2: 97% 93% 95% 96%   Weight:       Height:           Patient Lines/Drains/Airways Status       Active Lines, Drains and Airways       Name Placement date Placement time Site Days    Peripheral IV 10/28/21 22 G Posterior;Right Hand 10/28/21  1106  Hand  1    Urethral Catheter Latex 18 Fr. 10/28/21  1200  Latex  1

## 2021-10-29 NOTE — Progress Notes (Signed)
Worker met w/pt@bedside  on 10/29/21 to discuss [place] planning. Worker noted pt's [place] address and inquired as to pt's intended [place] address. Pt requested that this worker speak with daughter Lynden Ang. Worker spoke with [employer] via telephone and explained  order. Lynden Ang stated that she is attempting to coordinate transportation for the pt to [place] (she was initially en route at time of admission) but that "they are having trouble lining it up so close to Thanksgiving" and that "she may need to go to a rehab until we get her transport set up," Requested that this worker speak with her sister [name].    Marland Kitchenste

## 2021-10-29 NOTE — Nursing Progress Note (Signed)
Adult Observation Progress Note    Shift Note: Report received from outgoing RN, introduced self to patient, updated board. Head to toe assessment performed at beginning of shift.    General: Patient VSS, in no apparent distress at this time. Contact isolation for c-diff.  Neuro: Patient is AOx4. Patient denies numbness or tingling. Perrla.  Cardio: NSR on telemetry, verified with [facility]. S1, S2 auscultated.  Resp: Patient on 1L NC with lung sounds clear bilaterally on auscultation. Currently weaning off O2, patient 93% saturated with 1L.  Integ: Skin intact, no wounds or edema noted.  MSK: Patient ambulates with walker or wheelchair, high falls risk.  GI: Bowel sounds auscultated all 4 quadrants. Incontinent to stool.  GU: Patient is incontinent of urine. Foley in place.  IV Access: 22G in R hand    No significant events or provider communication. Patient reports 8/10 pain at this time, currently receiving robaxin, prednisone, lidocaine patches. Is resting comfortably in bed.    BM during shift: No, last BM 10/27/21    Pending Orders: Pain management, urine cultures, telemetry, IV abx, oxygen wean    Discharge Plan: TBD, pending medical clearance    Social/Family Visits: n/a    POC update: pt updated with POC during bedside report      Vitals:    10/29/21 0740 10/29/21 1117 10/29/21 1120 10/29/21 1123   BP: 121/70 113/63 93/58 (!) 80/34   Pulse: 97 86 94    Resp:       Temp:       TempSrc:       SpO2: 96%      Weight:       Height:           Patient Lines/Drains/Airways Status       Active Lines, Drains and Airways       Name Placement date Placement time Site Days    Peripheral IV 10/28/21 22 G Posterior;Right Hand 10/28/21  1106  Hand  1    Urethral Catheter Latex 18 Fr. 10/28/21  1200  Latex  1

## 2021-10-29 NOTE — Plan of Care (Signed)
Problem: Safety  Goal: Patient will be free from injury during hospitalization  Outcome: Progressing  Flowsheets (Taken 10/28/2021 0200 by Woldemeskel, Liyana, RN)  Patient will be free from injury during hospitalization:   Assess patient's risk for falls and implement fall prevention plan of care per policy   Use appropriate transfer methods   Include patient/ family/ care giver in decisions related to safety   Provide and maintain safe environment   Ensure appropriate safety devices are available at the bedside   Hourly rounding   Assess for patients risk for elopement and implement Elopement Risk Plan per policy   Provide alternative method of communication if needed (communication boards, writing)  Goal: Patient will be free from infection during hospitalization  Outcome: Progressing  Flowsheets (Taken 10/28/2021 0200 by Woldemeskel, Liyana, RN)  Free from Infection during hospitalization:   Assess and monitor for signs and symptoms of infection   Monitor lab/diagnostic results   Encourage patient and family to use good hand hygiene technique   Monitor all insertion sites (i.e. indwelling lines, tubes, urinary catheters, and drains)     Problem: Pain  Goal: Pain at adequate level as identified by patient  Outcome: Progressing  Flowsheets (Taken 10/28/2021 0200 by Woldemeskel, Liyana, RN)  Pain at adequate level as identified by patient:   Identify patient comfort function goal   Assess for risk of opioid induced respiratory depression, including snoring/sleep apnea. Alert healthcare team of risk factors identified.   Assess pain on admission, during daily assessment and/or before any "as needed" intervention(s)   Reassess pain within 30-60 minutes of any procedure/intervention, per Pain Assessment, Intervention, Reassessment (AIR) Cycle   Evaluate if patient comfort function goal is met   Evaluate patient's satisfaction with pain management progress   Offer non-pharmacological pain management interventions    Consult/collaborate with Pain Service   Consult/collaborate with Physical Therapy, Occupational Therapy, and/or Speech Therapy   Include patient/patient care companion in decisions related to pain management as needed     Problem: Side Effects from Pain Analgesia  Goal: Patient will experience minimal side effects of analgesic therapy  Outcome: Progressing  Flowsheets (Taken 10/28/2021 0200 by Woldemeskel, Liyana, RN)  Patient will experience minimal side effects of analgesic therapy:   Monitor/assess patient's respiratory status (RR depth, effort, breath sounds)   Prevent/manage side effects per LIP orders (i.e. nausea, vomiting, pruritus, constipation, urinary retention, etc.)   Assess for changes in cognitive function   Evaluate for opioid-induced sedation with appropriate assessment tool (i.e. POSS)     Problem: Discharge Barriers  Goal: Patient will be discharged home or other facility with appropriate resources  Outcome: Progressing  Flowsheets (Taken 10/28/2021 0200 by Woldemeskel, Liyana, RN)  Discharge to home or other facility with appropriate resources:   Provide appropriate patient education   Provide information on available health resources     Problem: Psychosocial and Spiritual Needs  Goal: Demonstrates ability to cope with hospitalization/illness  Outcome: Progressing  Flowsheets (Taken 10/28/2021 0200 by Woldemeskel, Liyana, RN)  Demonstrates ability to cope with hospitalizations/illness:   Encourage verbalization of feelings/concerns/expectations   Provide quiet environment   Assist patient to identify own strengths and abilities   Encourage patient to set small goals for self   Encourage participation in diversional activity   Reinforce positive adaptation of new coping behaviors   Include patient/ patient care companion in decisions     Problem: Compromised Tissue integrity  Goal: Damaged tissue is healing and protected  Outcome: Progressing  Flowsheets (Taken   10/28/2021 0200 by Woldemeskel,  Liyana, RN)  Damaged tissue is healing and protected:   Monitor/assess Braden scale every shift   Reposition patient every 2 hours and as needed unless able to reposition self   Increase activity as tolerated/progressive mobility   Relieve pressure to bony prominences for patients at moderate and high risk   Avoid shearing injuries   Keep intact skin clean and dry   Use bath wipes, not soap and water, for daily bathing   Use incontinence wipes for cleaning urine, stool and caustic drainage. Foley care as needed   Monitor external devices/tubes for correct placement to prevent pressure, friction and shearing   Monitor patient's hygiene practices   Encourage use of lotion/moisturizer on skin   Consult/collaborate with wound care nurse   Utilize specialty bed  Goal: Nutritional status is improving  Outcome: Progressing  Flowsheets (Taken 10/28/2021 0200 by Woldemeskel, Liyana, RN)  Nutritional status is improving:   Assist patient with eating   Allow adequate time for meals   Encourage patient to take dietary supplement(s) as ordered   Collaborate with Clinical Nutritionist   Include patient/patient care companion in decisions related to nutrition     Problem: Bladder/Voiding  Goal: Remains continent  Outcome: Progressing  Flowsheets (Taken 10/28/2021 0200 by Woldemeskel, Liyana, RN)  Remains continent:   Encourage toileting   Monitor intake and output   Encourage intermittent catheterization per implemented schedule   Encourage patient to empty bladder at regular intervals   Encourage patient to call for help when getting up to use the bathroom   Encourage patient to identify medications that aid bladder function prior to administration   Utilize bladder scans prior to or post void as appropriate  Goal: Perineal skin integrity is maintained or improved  Outcome: Progressing  Flowsheets (Taken 10/28/2021 0200 by Woldemeskel, Liyana, RN)  Perineal skin integrity is maintained or improved:   Keep intact skin clean and  dry   Apply urinary containment device as appropriate and/or per order   Use protective skin barriers to decrease potential skin breakdown  Goal: Free from infection  Outcome: Progressing  Flowsheets (Taken 10/28/2021 0200 by Woldemeskel, Liyana, RN)  Free from infection:   Monitor/assess for signs and symptoms of infection   Assess need for indwelling catheter every shift and discuss with LIP  Goal: Patient will experience proper bladder emptying during admission  Outcome: Progressing  Flowsheets (Taken 10/28/2021 0200 by Woldemeskel, Liyana, RN)  Patient will experience proper bladder emptying during admission:   Monitor intake and output   Encourage patient to empty bladder at regular intervals   Utilize bladder scans prior to or post void as appropriate   Patient will be in and out catheterized if unable to void   Encourage non-pharmacological interventions ( ie. Increase fluids, avoid caffeinated drinks)     Problem: Chest Pain  Goal: Vital signs and cardiac rhythm stable  Outcome: Progressing  Flowsheets (Taken 10/28/2021 0202 by Woldemeskel, Liyana, RN)  Vital signs and cardiac rhythm stable:   Monitor /assess vital signs/cardiac rhythms   Monitor labs   Assess the need for oxygen therapy and administer as ordered  Goal: Cardiac pain management  Outcome: Progressing  Flowsheets (Taken 10/28/2021 0202 by Woldemeskel, Liyana, RN)  Cardiac pain management:   Assess/report chest pain/or related discomfort to LIP immediately   Instruct patient to report any change in pain status   Assess pain/or related discomfort on admission, during daily assessment, before and after any intervention   Include   patient and patient care companion in decisions related to pain management  Goal: Anxiety management/effective coping  Outcome: Progressing  Flowsheets (Taken 10/28/2021 0202 by Woldemeskel, Liyana, RN)  Anxiety management/effective coping:   Assess/report uncontrolled anxiety, depression or ineffective coping to LIP    Encourage patient to immediately report any increase in anxiety and/or depression   Offer reassurance to decrease anxiety   Include patient in decision making of their care and give updates on their health status  Goal: Patient/Patient Care Companion demonstrates understanding of disease process, treatment plan, medications, and discharge plan  Outcome: Progressing  Flowsheets (Taken 10/28/2021 0202 by Woldemeskel, Liyana, RN)  Patient/Patient Care Companion demonstrates understanding of disease process, treatment plan, medications and discharge plan:   Educate patient to immediately report any chest pain/equivalent to RN   Reinforce with patient their activity level and to avoid Valsalva   Reinforce regarding cardiac diet and any fluid parameters   Assist patient/patient care companion to identify measures for cardiac risk factor management   Consult/collaborate with Cardiac Rehabilitation   Assess need for smoking cessation and substance abuse counseling and refer as needed   Educate patient/patient care companion regarding identified learning needs   Plan for discharge   Nutrition consult as needed

## 2021-10-30 DIAGNOSIS — R0902 Hypoxemia: Secondary | ICD-10-CM

## 2021-10-30 LAB — CBC AND DIFFERENTIAL
Absolute NRBC: 0 10*3/uL (ref 0.00–0.00)
Basophils Absolute Automated: 0.02 10*3/uL (ref 0.00–0.08)
Basophils Automated: 0.1 %
Eosinophils Absolute Automated: 0.04 10*3/uL (ref 0.00–0.44)
Eosinophils Automated: 0.2 %
Hematocrit: 26.2 % — ABNORMAL LOW (ref 34.7–43.7)
Hgb: 8.4 g/dL — ABNORMAL LOW (ref 11.4–14.8)
Immature Granulocytes Absolute: 0.09 10*3/uL — ABNORMAL HIGH (ref 0.00–0.07)
Immature Granulocytes: 0.5 %
Lymphocytes Absolute Automated: 1.36 10*3/uL (ref 0.42–3.22)
Lymphocytes Automated: 7.4 %
MCH: 28.6 pg (ref 25.1–33.5)
MCHC: 32.1 g/dL (ref 31.5–35.8)
MCV: 89.1 fL (ref 78.0–96.0)
MPV: 10.4 fL (ref 8.9–12.5)
Monocytes Absolute Automated: 1.39 10*3/uL — ABNORMAL HIGH (ref 0.21–0.85)
Monocytes: 7.6 %
Neutrophils Absolute: 15.37 10*3/uL — ABNORMAL HIGH (ref 1.10–6.33)
Neutrophils: 84.2 %
Nucleated RBC: 0 /100 WBC (ref 0.0–0.0)
Platelets: 431 10*3/uL — ABNORMAL HIGH (ref 142–346)
RBC: 2.94 10*6/uL — ABNORMAL LOW (ref 3.90–5.10)
RDW: 15 % (ref 11–15)
WBC: 18.27 10*3/uL — ABNORMAL HIGH (ref 3.10–9.50)

## 2021-10-30 LAB — BASIC METABOLIC PANEL
Anion Gap: 10 (ref 5.0–15.0)
BUN: 18 mg/dL (ref 7.0–21.0)
CO2: 26 mEq/L (ref 17–29)
Calcium: 9.4 mg/dL (ref 7.9–10.2)
Chloride: 96 mEq/L — ABNORMAL LOW (ref 99–111)
Creatinine: 1 mg/dL (ref 0.4–1.0)
Glucose: 106 mg/dL — ABNORMAL HIGH (ref 70–100)
Potassium: 4.2 mEq/L (ref 3.5–5.3)
Sodium: 132 mEq/L — ABNORMAL LOW (ref 135–145)

## 2021-10-30 LAB — MAGNESIUM: Magnesium: 1.9 mg/dL (ref 1.6–2.6)

## 2021-10-30 LAB — B-TYPE NATRIURETIC PEPTIDE: B-Natriuretic Peptide: 44 pg/mL (ref 0–100)

## 2021-10-30 LAB — GFR: EGFR: 52.7

## 2021-10-30 MED ORDER — ACETAMINOPHEN 500 MG PO TABS
1000.0000 mg | ORAL_TABLET | Freq: Three times a day (TID) | ORAL | Status: AC
Start: 2021-10-30 — End: ?

## 2021-10-30 MED ORDER — PREDNISONE 20 MG PO TABS
20.0000 mg | ORAL_TABLET | Freq: Every morning | ORAL | 0 refills | Status: AC
Start: 2021-10-31 — End: 2021-11-03

## 2021-10-30 MED ORDER — OXYCODONE HCL 5 MG PO TABS
5.0000 mg | ORAL_TABLET | ORAL | 0 refills | Status: AC | PRN
Start: 2021-10-30 — End: 2021-11-06

## 2021-10-30 MED ORDER — DIAZEPAM 2 MG PO TABS
2.0000 mg | ORAL_TABLET | Freq: Two times a day (BID) | ORAL | 0 refills | Status: AC | PRN
Start: 2021-10-30 — End: ?

## 2021-10-30 MED ORDER — POLYETHYLENE GLYCOL 3350 17 G PO PACK
17.0000 g | PACK | Freq: Every day | ORAL | Status: DC
Start: 2021-10-30 — End: 2021-11-02
  Administered 2021-10-30 – 2021-10-31 (×2): 17 g via ORAL
  Filled 2021-10-30 (×4): qty 1

## 2021-10-30 MED ORDER — DIAZEPAM 2 MG PO TABS
2.0000 mg | ORAL_TABLET | Freq: Two times a day (BID) | ORAL | Status: DC | PRN
Start: 2021-10-30 — End: 2021-11-02
  Administered 2021-10-30 – 2021-11-02 (×3): 2 mg via ORAL
  Filled 2021-10-30 (×3): qty 1

## 2021-10-30 MED ORDER — LIDOCAINE 5 % EX PTCH
2.0000 | MEDICATED_PATCH | CUTANEOUS | 0 refills | Status: AC
Start: 2021-10-30 — End: ?

## 2021-10-30 NOTE — Progress Notes (Signed)
PULSE OXIMETRY TESTING:  Document patient's oxygen at rest on room air:  _93__% on room air, at rest (no ranges please)  ___% on oxygen, at ___ LPM via NC  IF 88% OR BELOW on room air, STOP HERE,  IF NOT ambulate patient on room air with exertion and document below:  __92_% on room air, with exertion (must be 88% or below)  ___% on oxygen, with exertion, at ___ LPM via NC

## 2021-10-30 NOTE — Discharge Summary (Signed)
MEDICINE PROGRESS NOTE/DISCHARGE SUMMARY    Date Time: 10/30/21 4:12 PM  Patient Name: Jessica Mcbride  Attending Physician: Roxana Hires, MD  Primary Care Physician: Marisa Sprinkles, MD    Date of Admission: 10/27/2021  Date of Discharge: 10/30/21    Discharge Dx:   # Chest pain  (likley 2/2 Pleurisy vs Musculoskeletal pain/Fibromyalgia flare)  #Acute Hypoxemic Respiratory Failure (2/2 splinting/atelectasis)  #Chronic compression fractures s/p kyphoplasty T12 - L2  #Chronic Urinary Retention with PTA Foley  #Recent C Diff, (Failed oral Vancomycin) completing Dificid  #Leukocytosis   #Chronic Orthostatic Hypotension   #Chronic Normocytic  Anemia (Chronic, Stable)  #Polymyalgia Rheumatica (Flare likely)  #Fibromyalgia  #History of Temporal Arteritis   #History of Fbromyalgia   #MDS (s/p BMT 1999)- Noted  #HTN  #Hypothyroidism  #HDL  #Insomnia  #CKD III  #Hyponatremia  (Mild, Improved, Resolving)  #GERD  #Nausea (Resolved)    Disposition:  SNF     Discharge MEDICATIONS        Medication List        START taking these medications      acetaminophen 500 MG tablet  Commonly known as: TYLENOL  Take 2 tablets (1,000 mg) by mouth every 8 (eight) hours     diazePAM 2 MG tablet  Commonly known as: VALIUM  Take 1 tablet (2 mg) by mouth 2 (two) times daily as needed (anxiety)     lidocaine 5 %  Commonly known as: LIDODERM  Place 2 patches onto the skin every 24 hours Remove & Discard patch within 12 hours or as directed by MD     oxyCODONE 5 MG immediate release tablet  Commonly known as: ROXICODONE  Take 1 tablet (5 mg) by mouth every 4 (four) hours as needed for Pain            CHANGE how you take these medications      * predniSONE 20 MG tablet  Commonly known as: DELTASONE  Take 1 tablet (20 mg) by mouth every morning with breakfast for 3 days  Start taking on: October 31, 2021  What changed: You were already taking a medication with the same name, and this prescription was added. Make sure you understand how and  when to take each.     * predniSONE 5 MG tablet  Commonly known as: DELTASONE  Start taking on: November 03, 2021  What changed: These instructions start on November 03, 2021. If you are unsure what to do until then, ask your doctor or other care provider.           * This list has 2 medication(s) that are the same as other medications prescribed for you. Read the directions carefully, and ask your doctor or other care provider to review them with you.                CONTINUE taking these medications      folic acid 1 MG tablet  Commonly known as: FOLVITE     leflunomide 20 MG tablet  Commonly known as: ARAVA     levothyroxine 125 MCG tablet  Commonly known as: SYNTHROID     methocarbamol 500 MG tablet  Commonly known as: ROBAXIN     pantoprazole 40 MG tablet  Commonly known as: PROTONIX     pravastatin 20 MG tablet  Commonly known as: PRAVACHOL     propranolol 10 MG tablet  Commonly known as: INDERAL     traZODone 50 MG tablet  Commonly  known as: DESYREL     vitamin B complex (lipotropic) tablet            STOP taking these medications      amLODIPine 10 MG tablet  Commonly known as: NORVASC     fidaxomicin 200 MG Tabs  Commonly known as: DIFICID     traMADol 50 MG tablet  Commonly known as: ULTRAM               Where to Get Your Medications        These medications were sent to [facility] ([facility]) - [place], [place] - [address]  [address] [place] [address]      Phone: [phone]   predniSONE 20 MG tablet       You can get these medications from any pharmacy    Bring a paper prescription for each of these medications  diazePAM 2 MG tablet  oxyCODONE 5 MG immediate release tablet       Information about where to get these medications is not yet available    Ask your nurse or doctor about these medications  acetaminophen 500 MG tablet  lidocaine 5 %           Consultations:   Treatment Team: Attending Provider: Roxana Hires, MD; Nurse Practitioner: Brock Bad, FNP; Consulting Physician: Roxana Hires, MD; Nurse Practitioner: Leeanne Mannan, NP; Case Manager: Frederich Chick Registered Nurse: Jiles Crocker, RN   Recent Labs:   Recent Labs   Lab 10/30/21  0205   WBC 18.27*   Hgb 8.4*   Hematocrit 26.2*   Platelets 431*     Recent Labs   Lab 10/30/21  0205   Sodium 132*   Potassium 4.2   Chloride 96*   CO2 26   BUN 18.0   Creatinine 1.0   EGFR 52.7   Glucose 106*   Calcium 9.4       Recent Labs   Lab 10/29/21  0553   TSH 2.66      No results found for: T4. Recent Labs   Lab 10/27/21  1140   Troponin I <0.01                   Microbiology Results (last 15 days)       Procedure Component Value Units Date/Time    Urine culture [[id number]] Collected: 10/29/21 1205    Order Status: Completed Specimen: Bladder Updated: 10/30/21 1452    Narrative:      ORDER#: [id number]                                    ORDERED BY: Donata Clay  SOURCE: Urine                                        COLLECTED:  10/29/21 12:05  ANTIBIOTICS AT COLL.:                                RECEIVED :  10/29/21 12:10  Culture Urine  FINAL       10/30/21 14:51  10/30/21   No growth of >1,000 CFU/ML, No further work      Clostridium difficile toxin B PCR [[id number]]     Order Status: Canceled Specimen: Stool     COVID-19 (SARS-CoV-2) only ([facility]) asymptomatic admission - Hospitals [[id number]] Collected: 10/28/21 0948    Order Status: Completed Specimen: Nasopharyngeal Updated: 10/28/21 1023     Purpose of COVID testing Screening     SARS-CoV-2 Specimen Source Nasal Swab     SARS CoV 2 Overall Result Not Detected     Comment: __________________________________________________  -A result of "Detected" indicates POSITIVE for the    presence of SARS CoV-2 RNA  -A result of "Not Detected" indicates NEGATIVE for the    presence of SARS CoV-2 RNA  __________________________________________________________  Test performed using the Roche cobas Liat SARS-CoV-2  assay. This assay is  only for use under the Food and Drug Administrations Emergency Use  Authorization. This is a real-time RT-PCR assay for the qualitative  detection of SARS-CoV-2 RNA. Viral nucleic acids may persist in vivo,  independent of viability. Detection of viral nucleic acid does not imply the  presence of infectious virus, or that virus nucleic acid is the cause of  clinical symptoms. Negative results do not preclude SARS-CoV-2 infection and  should not be used as the sole basis for diagnosis, treatment or other  patient management decisions. Negative results must be combined with  clinical observations, patient history, and/or epidemiological information.  Invalid results may be due to inhibiting substances in the specimen and  recollection should occur. Please see Fact Sheets for patients and providers  located:  WirelessDSLBlog.no         Narrative:      o Collect and clearly label specimen type:  o PREFERRED-Upper respiratory specimen: One Nasal Swab in  Transport Media.  o Hand deliver to laboratory ASAP  Indication for testing->Extended care facility admission to  semi private room  Screening    Culture Blood Aerobic and Anaerobic [[id number]] Collected: 10/27/21 1621    Order Status: Completed Specimen: Blood, Venipuncture Updated: 10/29/21 1821    Narrative:      The order will result in two separate 8-10ml bottles  Please do NOT order repeat blood cultures if one has been  drawn within the last 48 hours  UNLESS concerned for  endocarditis  AVOID BLOOD CULTURE DRAWS FROM CENTRAL LINE IF POSSIBLE  Indications:->Pneumonia  ORDER#: [id number]                                    ORDERED BY: CHUA, ALEX  SOURCE: Blood, Venipuncture SP R                     COLLECTED:  10/27/21 16:21  ANTIBIOTICS AT COLL.:                                RECEIVED :  10/27/21 17:39  Culture Blood Aerobic and Anaerobic        PRELIM      10/29/21 18:21  10/28/21   No Growth after 1 day/s of  incubation.  10/29/21   No Growth after 2 day/s of incubation.      Culture Blood Aerobic and Anaerobic [[id number]] Collected: 10/27/21 1621    Order Status: Completed Specimen: Blood,  Venipuncture Updated: 10/29/21 1821    Narrative:      The order will result in two separate 8-36ml bottles  Please do NOT order repeat blood cultures if one has been  drawn within the last 48 hours  UNLESS concerned for  endocarditis  AVOID BLOOD CULTURE DRAWS FROM CENTRAL LINE IF POSSIBLE  Indications:->Pneumonia  ORDER#: [id number]                                    ORDERED BY: CHUA, ALEX  SOURCE: Blood, Venipuncture                          COLLECTED:  10/27/21 16:21  ANTIBIOTICS AT COLL.:                                RECEIVED :  10/27/21 17:39  Culture Blood Aerobic and Anaerobic        PRELIM      10/29/21 18:21  10/28/21   No Growth after 1 day/s of incubation.  10/29/21   No Growth after 2 day/s of incubation.      Urine culture [[id number]] Collected: 10/27/21 1409    Order Status: Completed Specimen: Bladder Updated: 10/28/21 1642    Narrative:      ORDER#: [id number]                                    ORDERED BY: Derrell Lolling, MARIO  SOURCE: Urine                                        COLLECTED:  10/27/21 14:09  ANTIBIOTICS AT COLL.:                                RECEIVED :  10/27/21 17:53  Culture Urine                              FINAL       10/28/21 16:41  10/28/21   >100,000 CFU/ML of multiple bacterial morphotypes present.             Possible contamination, appropriate recollection is             requested if clinically indicated.            Procedures/Radiology performed:   Radiology: all results from this admission  CT Angio Chest (PE or trauma protocol)    Result Date: 10/27/2021  1. No pulmonary embolism. 2. Trace bilateral pleural effusions and atelectasis. Bosie Helper, MD  10/27/2021 2:43 PM    CT Thoracic Spine WO Contrast    Result Date: 10/27/2021  1. No findings to suggest an acute thoracic compression  fracture. Sequela of previous/likely chronic compression fractures of T12, L1 and L2 with postsurgical changes likely related to prior vertebroplasty. 2. Small pleural effusions with findings of the lungs better appreciated on the recent CT of the chest. Fredrich Birks, MD  10/27/2021 7:23 PM    CT Lumbar Spine WO Contrast    Result Date: 10/27/2021  1.  No findings to suggest an acute/new compression deformity of the lumbar spine, although assessment is limited as no prior baseline imaging is available. Probable chronic compression fractures involving the T12, L1 and L2 vertebral bodies, with postsurgical changes related likely related to prior vertebroplasty. 2. Mild anterior wedging/slightly diminished height of the L3 vertebral body anteriorly which is age-indeterminate, and may be chronic. Fredrich Birks, MD  10/27/2021 7:16 PM    XR Chest  AP Portable    Result Date: 10/27/2021  Cardiomegaly, trace effusions and probable atelectasis. Streaky right perihilar opacities may represent early pneumonia. Follow-up to resolution is recommended. Bosie Helper, MD  10/27/2021 11:38 AM   ECHOCARDIOGRAM   Echo Results       None          Hospital Course:   Reason for admission/ HPI:  Malayasia Mirkin is a 87 y.o. female with a PMHx of PMR, temporal arteritis, myelodysplastic syndrome s/p BMT 1999, chronic anemia, HTN, CAD, hx of CVA, hypothyroidism, HDL, diverticulosis, CKD 3B, chronic chest pain, chronic orthostatic hypotension, urinary retention (PTA foley cath), C. Diff( failed oral vancomycin-on fidaxomicin), recent UTI, vertebral fractures, L2 kyphoplasty 9/22, admitted 10/27/2021 to observation for  who presents to the hosptial with left-sided chest pain, SOB and hypoxia.    Hospital Course:   Arrived to ED with BP 105/60, HR 95, RR 21, 95% on room air, afebrile. EKG shows sinus tachycardia, HR 111. Labs on admission were notable for WBC 16.74, H&H 10.7/34.5, PLT 525. UA noted large leukocytes, 1+ protein, large  blood, RBC & WBC TNTC. blood and urine culture collected (Blood cx NGTD x 2 days and urine cx negative).  CRP 14, ESR 84.  CXR showed cardiomegaly, trace effusion and right perihilar opacities (Procalcitonin  0.15). CT T and L spine showed chronic compression fx of T12. CTA chest also completed and showed no evidence of PE.     Chest pain more consistent with MSK pain, fibromyalgia  and/or PMR flare. Started on multimodal pain medication. PTA Foley exchanged on 10/28/21.  Additionally ordered levofloxacin for UTI; however D/C'd after 3 doses per ID recommendations, foley exchange and repeat UA. As no sx UTI, suspect contamination. IV lasix  also given for suspected mild fluid overload contributing to hypoxia. Prednisone dose increased to 20 mg daily x 5 days for suspected PMR flare/pleurisy. 11/21-11/22 patient weaned from supplental oxygen and states pain is manageable. She remains hemodynamically stable and is medically discharged pending transfer to her living facility in [place] vs transfer to SNF (per PT/OT recommendations). Patient is willing to go to SNF for a short stay, however authorization is pending approval. After discharge patient is instructed to continue/complete 5 day prednisone burst of 20 mg, follow-up by PTA dose of 5 mg Prednisone thereafter. Additionally, patient is prescribed PRN oxycodone, Lidoderm, Tylenol,  and Robaxin for pain with PTA PRN Valium for anxiety. Her amlodipne was held as BP soft. Team also encouraged patient to follow-up with PCP within 1 week of discharge. Patient questions answered and she expressed understanding of discharge plan.     Currently the patient is hemodynamically stable for discharge with outpatient follow up as outlined below.    Discharge Day Exam:  DISCHARGE DAY EXAM:  BP 135/74    Pulse 94    Temp 98.9 F (37.2 C) (Oral)    Resp 15    Ht 1.676 m (5\' 6" )    Wt 77.1 kg (170 lb)    SpO2 94%    BMI  27.44 kg/m   Body mass index is 27.44  kg/m.      Discharge Condition & Coordination:     Condition: Stable    Coordination  No coordination needed  Updated patient with discharge plan, all questions answered    Emergency Contact  Extended Emergency Contact Information  Primary Emergency Contact: May,Julie  Mobile Phone: [phone]  Relation: Daughter  Secondary Emergency Contact: Mittleman,John  Mobile Phone: [phone]  Relation: Son    Pending Results, Recommendations & Instructions to providers after discharge:   Micro / Labs / Path pending:   Unresulted Labs       Procedure . . . Date/Time    B-type Natriuretic Peptide [[id number]] Collected: 10/28/21 0311    Specimen: Blood Updated: 10/28/21 0313    Procalcitonin [[id number]] Collected: 10/28/21 0311     Updated: 10/28/21 0313    Narrative:      Aliquot serum prior to transport to [facility]              Discharge Instructions:   Diet:: Cardiac Consistent Carbohydrates  Activity: as tolerated    Reason for your Hospital Admission:  Chest pain - suspected fibromyalgia flare, possible anxiety component  Low oxygen - likely related to "atelectasis" (airways not opening up all the way due to shallow breathing)     You were admitted for evaluation of chest pain. Your heart testing was normal - no sign of heart attack or inflammation. Your chest CT showed a small amount of fluid buildup around the lungs (small pleural effusions) and atelectasis. There were no blood clots in the lungs and no sign of infection. COVID test was normal.         Instructions for after your discharge:  -- Schedule a follow up visit with your primary care provider within 1 week of discharge from the hospital.     -- Advise the following pain regimen:  Increase prednisone to 20mg  daily through 11/25. On 11/26, resume 5mg  daily  Tylenol 1000mg  three times daily for 5-7 days  Lidocaine patches daily  Robaxin three times daily as previously prescribed  Oxycodone 5mg  every 4-6 hours as needed. Do not take tramadol while taking  oxycodone  Valium 2mg  as needed for anxiety as previously prescribed. Should not be on this longterm, discuss with your PCP anti-anxiety medications     -- STOP: amlodipine because your blood pressure was running low/normal without it.   -- Check and record blood pressure and pulse twice daily and when not feeling well, take log with you to primary care visit/follow up.   -- Your PCP will decide if/when to resume the amlodipine     -- STOP: Dificid. You completed the course during this hospitalization.     -- Hydrate well, activity as tolerated     -- Return to the nearest emergency department for chest pain, shortness of breath, fever >100.5 degrees, dizziness, passing out, profuse vomiting or diarrhea, or any other concerning symptoms       Complete instructions and follow up are in the patient's After Visit Summary    Minutes spent coordinating discharge and reviewing discharge plan:40 minutes     Follow-up Information       Primary Care Doctor Follow up.                                Signed by: Leeanne Mannan, NP  Discussed in detail with my attending provider:  Roxana Hires, MD    [place] [facility]  ADULT OBSERVATION UNIT [id number] F: [phone]  [facility]   Department of Medicine   P: [phone]   F: [phone]    CC: Pcp, None, MD    Attending Attestation:   I have reviewed the interval history, images, and pertinent test results.  I have personally examined the patient and confirmed the major physical findings of the preceding discharge note by Roe Rutherford, NP.  I agree with the preceding note with minor changes made above.    Roxana Hires, MD

## 2021-10-30 NOTE — OT Progress Note (Signed)
Occupational Therapy Treatment Kennith Maes [place]        Post Acute Care Therapy Recommendations:     Discharge Recommendations:  ALF, Home with home health OT    DME needs IF patient is discharging home: Shower chair (patient owns RW and wheelchair)    Therapy discharge recommendations may change with patient status.  Please refer to most recent note for up-to-date recommendations.  Assessment:   Significant Findings: none, O2 sats 95% on RA at rest and after transfer to chair    Patient agreeable to therapy this session, received in bed. Educated on exercises in bed prior to sitting and abdominal binder to assist with BP drop. Required Min A to don abdominal binder and SBA-CGA for SPT to chair, slight dizziness however not orthostatic. Completed simple UB ADLs in chair with no difficulty. Patient will continue to benefit from acute OT services.        Patient left without needs and call bell within reach. RN notified of session outcome.       Treatment Activities: ADL retraining, Functional transfer training, and Compensatory technique education    Educated the patient to role of occupational therapy, plan of care, goals of therapy and safety with mobility and ADLs.    Plan:   OT Frequency Recommended: 3-4x/wk     Goal Formulation: Patient  OT Plan  Risks/Benefits/POC Discussed with Pt/Family: With patient  Treatment Interventions: ADL retraining, Functional transfer training, UE strengthening/ROM, Endurance training, Patient/Family training, Equipment eval/education, Compensatory technique education  Discharge Recommendation: ALF, Home with home health OT  DME Recommended for Discharge: Shower chair (patient owns RW and wheelchair)  OT Frequency Recommended: 3-4x/wk  OT - Next Visit Recommendation: 11/01/21    Continue plan of care.    Unit: [facility] TOWER 6  Bed: F631/F631.01         Precautions and Contraindications:   Precautions  Weight Bearing Status: no restrictions  Other  Precautions: Falls, monitor BP    Updated Medical Status/Imaging/Labs:  Reviewed     Subjective: I get dizzy all the time   Patient's medical condition is appropriate for Occupational Therapy intervention at this time.  Patient is agreeable to participation in the therapy session.        Pain: slight chest pain with rolling, subsided after          Objective:   Observation of Patient/Vital Signs:  Patient is in bed with telemetry, IV access in place.  Pt wore mask during therapy session:No      Cognition/Neuro Status  Arousal/Alertness: Appropriate responses to stimuli  Attention Span: Appears intact  Orientation Level: Oriented to place;Oriented to situation;Oriented to person  Memory: Appears intact  Following Commands: Follows all commands and directions without difficulty  Safety Awareness: minimal verbal instruction  Insights: Educated in safety awareness  Behavior: cooperative    Functional Mobility  Supine to Sit Transfers: Stand by Assist  Sit to Stand Transfers: Stand by Assist  Bed to Chair Transfers: Contact Guard Assist (HHA)    Self-care and Home Management  Eating: in a chair;Independent  Grooming: Stand by Assist;edge of bed;wash/dry hands;wash/dry face (sitting)  UB Dressing: Minimal Assist (abdominal binder)  Functional Transfers: Contact Guard Assist                        PMP Activity: Step 5 - Chair               Participation: good  Endurance:  good    Patient left with call bell within reach, all needs met, SCDs off, fall mat on, bed alarm off, chair alarm on and all questions answered. RN notified of session outcome and patient response.     Goals:    Time For Goal Achievement: 3 visits  ADL Goals  Patient will groom self: Supervision (sitting at sink)  Patient will toilet: Stand by Assist  Mobility and Transfer Goals  Pt will perform functional transfers: Stand by Assist (RW short distances)        Executive Fucntion Goals  Pt will follow energy conservation techniques: with stand by assist,  to increase ability to complete ADLs                PPE worn during session: procedural mask and gloves, gown    Tech present: No   PPE worn by tech: N/A    Time of Treatment  OT Received On: 10/30/21  Start Time: 0915  Stop Time: 0950  Time Calculation (min): 35 min  Treatment # 1 of 3 visits    Juanetta Beets, OTR/L  Pager [phone]

## 2021-10-30 NOTE — Plan of Care (Signed)
Adult Observation Progress Note      Shift Note: Received report from outgoing RN, introduced self to patient, and updated white board.  General: VSS, Pt reports pain 7/10 to L. Rib cage. Lidocaine applied as per order.  Neuro: A&Ox3-4, forgetful. No neuro deficits.  Cardio: Tele is in place, NSR. S1, S2 Auscultated.  Resp: Weaned to RA, SpO2 93%.  Integ: Blanchable redness to sacrum  MSK:  High fall risk level, safety precautions are in place. Pt ambulates with walker and X1 assist.  GI: Continent. LBM 11/19. Bowel sounds present.  GU: Foley Cath in place.   IV Access: 20G L. FA     BM during shift: YES/NO: no (If >3 days with no BM, list interventions: )    Pending Orders: CHG/ Foley Care, Check ambulatory pulse ox, PT/OT, Compression stockings    Discharge Plan:  Pending MD Clearance.    Social/Family Visits: Not at this time    POC update: Pt was updated and verbalized understanding.         Problem: Safety  Goal: Patient will be free from injury during hospitalization  Outcome: Progressing  Flowsheets (Taken 10/30/2021 0929)  Patient will be free from injury during hospitalization:   Assess patient's risk for falls and implement fall prevention plan of care per policy   Use appropriate transfer methods   Ensure appropriate safety devices are available at the bedside   Hourly rounding   Provide and maintain safe environment  Goal: Patient will be free from infection during hospitalization  Outcome: Progressing  Flowsheets (Taken 10/30/2021 0929)  Free from Infection during hospitalization:   Assess and monitor for signs and symptoms of infection   Monitor all insertion sites (i.e. indwelling lines, tubes, urinary catheters, and drains)   Encourage patient and family to use good hand hygiene technique     Problem: Pain  Goal: Pain at adequate level as identified by patient  Outcome: Progressing  Flowsheets (Taken 10/30/2021 0929)  Pain at adequate level as identified by patient:   Identify patient comfort  function goal   Assess for risk of opioid induced respiratory depression, including snoring/sleep apnea. Alert healthcare team of risk factors identified.   Assess pain on admission, during daily assessment and/or before any "as needed" intervention(s)   Reassess pain within 30-60 minutes of any procedure/intervention, per Pain Assessment, Intervention, Reassessment (AIR) Cycle     Problem: Side Effects from Pain Analgesia  Goal: Patient will experience minimal side effects of analgesic therapy  Outcome: Progressing  Flowsheets (Taken 10/30/2021 0929)  Patient will experience minimal side effects of analgesic therapy:   Monitor/assess patient's respiratory status (RR depth, effort, breath sounds)   Prevent/manage side effects per LIP orders (i.e. nausea, vomiting, pruritus, constipation, urinary retention, etc.)   Assess for changes in cognitive function     Problem: Discharge Barriers  Goal: Patient will be discharged home or other facility with appropriate resources  Outcome: Progressing  Flowsheets (Taken 10/30/2021 0929)  Discharge to home or other facility with appropriate resources:   Provide appropriate patient education   Provide information on available health resources     Problem: Psychosocial and Spiritual Needs  Goal: Demonstrates ability to cope with hospitalization/illness  Outcome: Progressing  Flowsheets (Taken 10/30/2021 0929)  Demonstrates ability to cope with hospitalizations/illness:   Encourage verbalization of feelings/concerns/expectations   Assist patient to identify own strengths and abilities   Encourage participation in diversional activity   Provide quiet environment   Encourage patient to set small  goals for self     Problem: Compromised Tissue integrity  Goal: Damaged tissue is healing and protected  Outcome: Progressing  Flowsheets (Taken 10/30/2021 0929)  Damaged tissue is healing and protected:   Monitor/assess Braden scale every shift   Reposition patient every 2 hours and as  needed unless able to reposition self   Provide wound care per wound care algorithm   Increase activity as tolerated/progressive mobility   Relieve pressure to bony prominences for patients at moderate and high risk   Keep intact skin clean and dry  Goal: Nutritional status is improving  Outcome: Progressing  Flowsheets (Taken 10/30/2021 0929)  Nutritional status is improving:   Assist patient with eating   Allow adequate time for meals   Encourage patient to take dietary supplement(s) as ordered     Problem: Bladder/Voiding  Goal: Remains continent  Outcome: Progressing  Flowsheets (Taken 10/30/2021 0929)  Remains continent:   Initiate bladder training program   Encourage toileting   Monitor intake and output   Encourage intermittent catheterization per implemented schedule  Goal: Perineal skin integrity is maintained or improved  Outcome: Progressing  Flowsheets (Taken 10/30/2021 0929)  Perineal skin integrity is maintained or improved:   Keep intact skin clean and dry   Apply urinary containment device as appropriate and/or per order   Use protective skin barriers to decrease potential skin breakdown  Goal: Free from infection  Outcome: Progressing  Flowsheets (Taken 10/30/2021 0929)  Free from infection:   Monitor/assess for signs and symptoms of infection   Assess need for indwelling catheter every shift and discuss with LIP  Goal: Patient will experience proper bladder emptying during admission  Outcome: Progressing  Flowsheets (Taken 10/30/2021 0929)  Patient will experience proper bladder emptying during admission:   Monitor intake and output   Encourage patient to empty bladder at regular intervals   Utilize bladder scans prior to or post void as appropriate   Patient will be in and out catheterized if unable to void     Problem: Chest Pain  Goal: Vital signs and cardiac rhythm stable  Outcome: Progressing  Flowsheets (Taken 10/30/2021 0929)  Vital signs and cardiac rhythm stable:   Monitor /assess vital  signs/cardiac rhythms   Assess the need for oxygen therapy and administer as ordered   Monitor labs  Goal: Cardiac pain management  Outcome: Progressing  Flowsheets (Taken 10/30/2021 0929)  Cardiac pain management:   Assess/report chest pain/or related discomfort to LIP immediately   Assess pain/or related discomfort on admission, during daily assessment, before and after any intervention   Instruct patient to report any change in pain status  Goal: Anxiety management/effective coping  Outcome: Progressing  Flowsheets (Taken 10/30/2021 0929)  Anxiety management/effective coping:   Assess/report uncontrolled anxiety, depression or ineffective coping to LIP   Encourage patient to immediately report any increase in anxiety and/or depression   Include patient in decision making of their care and give updates on their health status  Goal: Patient/Patient Care Companion demonstrates understanding of disease process, treatment plan, medications, and discharge plan  Outcome: Progressing  Flowsheets (Taken 10/30/2021 0929)  Patient/Patient Care Companion demonstrates understanding of disease process, treatment plan, medications and discharge plan:   Educate patient to immediately report any chest pain/equivalent to RN   Reinforce with patient their activity level and to avoid Valsalva   Reinforce regarding cardiac diet and any fluid parameters   Nutrition consult as needed

## 2021-10-30 NOTE — PT Progress Note (Signed)
Physical Therapy Treatment  Jessica Mcbride   Post Acute Care Therapy Recommendations:   Discharge Recommendations: SNF    If SNF recommended discharge disposition is not available, patient will need hands on assist for mobility and ADLs, supervision for safety, skilled transport into home and home health PT services.     DME needs IF patient discharges home: No additional equipment/DME recommended at this time, Patient already has needed equipment    Therapy discharge recommendations may change with patient status.  Please refer to most recent note for up-to-date recommendations.  Assessment:   Significant Findings: none    Pt approached for skilled PT tx with RN consent, received semisupine in bed, agreeable to participate. Pt expressing frustration with inconsistent symptoms. Performed sit<>supine with supervision. Tolerated seated LE therex at EOB prior to mobility. Sit<>stands with CGA. Amb 67ft with CGA and RW. Slow, short, reciprocal gait. Limited by fatigue and mild dizziness. After seated rest, stand step xfer bed>chair with CGA. Completed session seated in bedside chair. Pt will continue to benefit from skilled PT services to address strength, balance and endurance impairments in order to maximize functional independence.      Assessment: Decreased safety/judgement during functional mobility, Decreased endurance/activity tolerance, Decreased functional mobility, Decreased balance, Gait impairment  Progress: Progressing toward goals  Prognosis: Good, With continued PT status post acute discharge  Risks/Benefits/POC Discussed with Pt/Family: With patient  Patient left without needs and call bell within reach. RN notified of session outcome.     Treatment Activities: gait training, therex, pt education    Educated the patient to role of physical therapy, plan of care, goals of therapy and safety with mobility and ADLs.    Plan:   Treatment/Interventions: Exercise, Gait training, Neuromuscular  re-education, Endurance training        PT Frequency: 2-3x/wk     Continue plan of care.     Unit: [facility] TOWER 6  Bed: F631/F631.01    Precautions and Contraindications:   Precautions  Weight Bearing Status: no restrictions  Other Precautions: Falls, monitor BP    Updated Medical Status/Imaging/Labs: Reviewed    Subjective: "I would love if you could just stay here with me for 10 minutes."   Patient Goal: to feel better    Pain Assessment  Pain Assessment: No/denies pain    Patient's medical condition is appropriate for Physical Therapy intervention at this time.  Patient is agreeable to participation in the therapy session. Nursing clears patient for therapy.    Objective:   Observation of Patient/Vital Signs:  Patient is in bed with telemetry and peripheral IV in place.  Pt wore mask during therapy session:No      Cognition/Neuro Status  Arousal/Alertness: Appropriate responses to stimuli  Attention Span: Appears intact  Orientation Level: Oriented to place;Oriented to situation;Oriented to person  Memory: Appears intact  Following Commands: Follows all commands and directions without difficulty  Safety Awareness: minimal verbal instruction  Insights: Educated in Engineer, building services  Behavior: cooperative         Functional Mobility:  Supine to Sit: Supervision  Scooting to EOB: Supervision  Sit to Stand: Risk analyst  Stand to Sit: Contact Guard Assist  Transfers  Bed to Chair: Advertising account executive Assist (Stand step)    Ambulation:  PMP - Progressive Mobility Protocol   PMP Activity: Step 6 - Walks in Room  Distance Walked (ft) (Step 6,7): 20 Feet     Ambulation: Contact Guard Assist;with front-wheeled walker  Pattern: decreased  cadence;decreased step length       Patient Participation: good  Patient Endurance: fair    Patient left with call bell within reach, all needs met, SCDs off, fall mat in place, chair alarm on and all questions answered. RN notified of session outcome and patient  response.     Goals:  Goals  Goal Formulation: With patient  Time for Goal Acheivement: 5 visits  Goals: Select goal  Pt Will Perform Sit to Stand: with stand by assist  Pt Will Transfer Bed/Chair: with rolling walker, with stand by assist  Pt Will Ambulate: 11-30 feet, with rolling walker, with stand by assist  Pt Will Propel Wheelchair: 51-150 feet, modified independent    PPE worn during session: procedural mask and gloves    Tech present: none  PPE worn by tech: N/A    Leavy Cella, PT, DPT Pager # [phone] 10/30/2021 2:43 PM       Time of Treatment  PT Received On: 10/30/21  Start Time: 1215  Stop Time: 1245  Time Calculation (min): 30 min  Treatment # 1 out of 5 visits

## 2021-10-30 NOTE — Plan of Care (Addendum)
Adult Observation Progress Note     Shift Note:     General: Patient VSS , in no apparent distress at this time. Contact isolation for C.diff  Neuro: Patient is Aox4 baseline numbness to BLE.   Cardio: not on telemetry. S1, S2 auscultated.   Resp: Patient weaned off O2, room air with lung sounds clear bilaterally on auscultation. DOE  Integ: Skin intact, no wounds or edema noted. Blanchable redness to sacrum  MSK: Patient ambulates: with walker with: 1-person assist. low/mod/high: High falls risk.   GI: Bowel sounds auscultated all 4 quadrants. 11/19. Stool softener given   GU: Patient is continent of urine. Foley in place for urinary retention   IV Access: IV: 20g  in: L forearm.      BM during shift: YES/NO: no     Pending Orders: pain control, Foley care, SNF placement      Discharge Plan: TBD, pending medical clearance     Social/Family Visits: no     POC update: pt updated with POC during bedside report     Problem: Safety  Goal: Patient will be free from injury during hospitalization  Outcome: Progressing  Flowsheets (Taken 10/30/2021 0929 by Jiles Crocker, RN)  Patient will be free from injury during hospitalization:   Assess patient's risk for falls and implement fall prevention plan of care per policy   Use appropriate transfer methods   Ensure appropriate safety devices are available at the bedside   Hourly rounding   Provide and maintain safe environment  Goal: Patient will be free from infection during hospitalization  Outcome: Progressing  Flowsheets (Taken 10/30/2021 0929 by Jiles Crocker, RN)  Free from Infection during hospitalization:   Assess and monitor for signs and symptoms of infection   Monitor all insertion sites (i.e. indwelling lines, tubes, urinary catheters, and drains)   Encourage patient and family to use good hand hygiene technique     Problem: Pain  Goal: Pain at adequate level as identified by patient  Outcome: Progressing  Flowsheets (Taken 10/30/2021 0929 by Jiles Crocker,  RN)  Pain at adequate level as identified by patient:   Identify patient comfort function goal   Assess for risk of opioid induced respiratory depression, including snoring/sleep apnea. Alert healthcare team of risk factors identified.   Assess pain on admission, during daily assessment and/or before any "as needed" intervention(s)   Reassess pain within 30-60 minutes of any procedure/intervention, per Pain Assessment, Intervention, Reassessment (AIR) Cycle     Problem: Side Effects from Pain Analgesia  Goal: Patient will experience minimal side effects of analgesic therapy  Outcome: Progressing  Flowsheets (Taken 10/30/2021 0929 by Jiles Crocker, RN)  Patient will experience minimal side effects of analgesic therapy:   Monitor/assess patient's respiratory status (RR depth, effort, breath sounds)   Prevent/manage side effects per LIP orders (i.e. nausea, vomiting, pruritus, constipation, urinary retention, etc.)   Assess for changes in cognitive function     Problem: Discharge Barriers  Goal: Patient will be discharged home or other facility with appropriate resources  Outcome: Progressing  Flowsheets (Taken 10/30/2021 0929 by Jiles Crocker, RN)  Discharge to home or other facility with appropriate resources:   Provide appropriate patient education   Provide information on available health resources     Problem: Psychosocial and Spiritual Needs  Goal: Demonstrates ability to cope with hospitalization/illness  Outcome: Progressing  Flowsheets (Taken 10/30/2021 0929 by Jiles Crocker, RN)  Demonstrates ability to cope with hospitalizations/illness:   Encourage verbalization of feelings/concerns/expectations  Assist patient to identify own strengths and abilities   Encourage participation in diversional activity   Provide quiet environment   Encourage patient to set small goals for self     Problem: Compromised Tissue integrity  Goal: Damaged tissue is healing and protected  Outcome: Progressing  Flowsheets  (Taken 10/30/2021 0929 by Jiles Crocker, RN)  Damaged tissue is healing and protected:   Monitor/assess Braden scale every shift   Reposition patient every 2 hours and as needed unless able to reposition self   Provide wound care per wound care algorithm   Increase activity as tolerated/progressive mobility   Relieve pressure to bony prominences for patients at moderate and high risk   Keep intact skin clean and dry  Goal: Nutritional status is improving  Outcome: Progressing  Flowsheets (Taken 10/30/2021 0929 by Jiles Crocker, RN)  Nutritional status is improving:   Assist patient with eating   Allow adequate time for meals   Encourage patient to take dietary supplement(s) as ordered     Problem: Bladder/Voiding  Goal: Remains continent  Outcome: Progressing  Flowsheets (Taken 10/30/2021 0929 by Jiles Crocker, RN)  Remains continent:   Initiate bladder training program   Encourage toileting   Monitor intake and output   Encourage intermittent catheterization per implemented schedule  Goal: Perineal skin integrity is maintained or improved  Outcome: Progressing  Flowsheets (Taken 10/30/2021 0929 by Jiles Crocker, RN)  Perineal skin integrity is maintained or improved:   Keep intact skin clean and dry   Apply urinary containment device as appropriate and/or per order   Use protective skin barriers to decrease potential skin breakdown  Goal: Free from infection  Outcome: Progressing  Flowsheets (Taken 10/30/2021 0929 by Jiles Crocker, RN)  Free from infection:   Monitor/assess for signs and symptoms of infection   Assess need for indwelling catheter every shift and discuss with LIP  Goal: Patient will experience proper bladder emptying during admission  Outcome: Progressing  Flowsheets (Taken 10/30/2021 0929 by Jiles Crocker, RN)  Patient will experience proper bladder emptying during admission:   Monitor intake and output   Encourage patient to empty bladder at regular intervals   Utilize bladder  scans prior to or post void as appropriate   Patient will be in and out catheterized if unable to void     Problem: Chest Pain  Goal: Vital signs and cardiac rhythm stable  Outcome: Progressing  Flowsheets (Taken 10/30/2021 0929 by Jiles Crocker, RN)  Vital signs and cardiac rhythm stable:   Monitor Earnest Bailey vital signs/cardiac rhythms   Assess the need for oxygen therapy and administer as ordered   Monitor labs  Goal: Cardiac pain management  Outcome: Progressing  Flowsheets (Taken 10/30/2021 0929 by Jiles Crocker, RN)  Cardiac pain management:   Assess/report chest pain/or related discomfort to LIP immediately   Assess pain/or related discomfort on admission, during daily assessment, before and after any intervention   Instruct patient to report any change in pain status  Goal: Anxiety management/effective coping  Outcome: Progressing  Flowsheets (Taken 10/30/2021 0929 by Jiles Crocker, RN)  Anxiety management/effective coping:   Assess/report uncontrolled anxiety, depression or ineffective coping to LIP   Encourage patient to immediately report any increase in anxiety and/or depression   Include patient in decision making of their care and give updates on their health status  Goal: Patient/Patient Care Companion demonstrates understanding of disease process, treatment plan, medications, and discharge plan  Outcome: Progressing  Flowsheets (Taken 10/30/2021 0929 by Jiles Crocker,  RN)  Patient/Patient Care Companion demonstrates understanding of disease process, treatment plan, medications and discharge plan:   Educate patient to immediately report any chest pain/equivalent to RN   Reinforce with patient their activity level and to avoid Valsalva   Reinforce regarding cardiac diet and any fluid parameters   Nutrition consult as needed

## 2021-10-31 LAB — GLUCOSE WHOLE BLOOD - POCT: Whole Blood Glucose POCT: 164 mg/dL — ABNORMAL HIGH (ref 70–100)

## 2021-10-31 NOTE — Progress Notes (Addendum)
Telephone call on 10/31/21  from [facility] worker [place]. (Auth initiated with [employer] on 10/31/21). Per Chana Bode, facility is not in network with pt's insurance (unclear as to why Berkley Harvey was initiated in light of same) and facility is no longer able to accept. Writer to contact pt's daughter [name] on this date.    Frederich Chick, LCSW  Case Management and Discharge Planning  [phone]     Addendum:    Discussion held w/pt's daughter [name] on 10/31/21. Explained that, unfortunately, [facility] is not able  to accept the pt at this time. Daughter agreed that the parties should plan on [place] to [facility] [place] on 10/31/21; write to contact [facility] to arrange transport.    Frederich Chick, LCSW  Case Management and Discharge Planning  [phone]

## 2021-10-31 NOTE — Progress Notes (Signed)
Telephone call on 10/31/21 to [facility] to arrange pt's transport to [facility]. Spoke with worker Jessica Mcbride; transport is scheduled for 11/02/21@ 2:00 PM. Per Jessica Mcbride's request, clinicals faxed on this date.    Frederich Chick, LCSW  Case Management and Discharge Planning  [phone]

## 2021-10-31 NOTE — Plan of Care (Signed)
Adult Observation Progress Note      Shift Note: Received report from outgoing RN, introduced self to patient, and updated white board.  General: VSS, Pt denies any pain at this time, 0/10.  Neuro: A&Ox4. Forgetful. No neuro deficits.  Cardio: Tele discontinued. S1, S2 Auscultated.  Resp: Clear on RA  Integ: Intact  MSK: High fall risk level, safety precautions are in place. Pt ambulates with walker and x1 person assist.  GI: Continent. LBM 11/23 per nightshift RN. Bowel sounds present.  GU: Foley is in place. CHG, Foley care completed.  IV Access: 20G L. FA     BM during shift: YES/NO: no (If >3 days with no BM, list interventions: )    Pending Orders: D/C to SNF    Discharge Plan: D/C to SNF or Facility in [place] where patient is moving to.    Social/Family Visits: Not at this time    POC update: Pt was updated and verbalized understanding.         Problem: Safety  Goal: Patient will be free from injury during hospitalization  Outcome: Progressing  Flowsheets (Taken 10/31/2021 1127)  Patient will be free from injury during hospitalization:   Assess patient's risk for falls and implement fall prevention plan of care per policy   Use appropriate transfer methods   Ensure appropriate safety devices are available at the bedside   Include patient/ family/ care giver in decisions related to safety   Hourly rounding   Provide and maintain safe environment  Goal: Patient will be free from infection during hospitalization  Outcome: Progressing  Flowsheets (Taken 10/31/2021 1127)  Free from Infection during hospitalization:   Assess and monitor for signs and symptoms of infection   Monitor lab/diagnostic results   Monitor all insertion sites (i.e. indwelling lines, tubes, urinary catheters, and drains)   Encourage patient and family to use good hand hygiene technique     Problem: Pain  Goal: Pain at adequate level as identified by patient  Outcome: Progressing  Flowsheets (Taken 10/31/2021 1127)  Pain at adequate level as  identified by patient:   Identify patient comfort function goal   Assess for risk of opioid induced respiratory depression, including snoring/sleep apnea. Alert healthcare team of risk factors identified.   Assess pain on admission, during daily assessment and/or before any "as needed" intervention(s)   Reassess pain within 30-60 minutes of any procedure/intervention, per Pain Assessment, Intervention, Reassessment (AIR) Cycle     Problem: Side Effects from Pain Analgesia  Goal: Patient will experience minimal side effects of analgesic therapy  Outcome: Progressing  Flowsheets (Taken 10/31/2021 1127)  Patient will experience minimal side effects of analgesic therapy:   Monitor/assess patient's respiratory status (RR depth, effort, breath sounds)   Prevent/manage side effects per LIP orders (i.e. nausea, vomiting, pruritus, constipation, urinary retention, etc.)   Evaluate for opioid-induced sedation with appropriate assessment tool (i.e. POSS)     Problem: Discharge Barriers  Goal: Patient will be discharged home or other facility with appropriate resources  Outcome: Progressing  Flowsheets (Taken 10/31/2021 1127)  Discharge to home or other facility with appropriate resources:   Provide appropriate patient education   Provide information on available health resources     Problem: Psychosocial and Spiritual Needs  Goal: Demonstrates ability to cope with hospitalization/illness  Outcome: Progressing  Flowsheets (Taken 10/31/2021 1127)  Demonstrates ability to cope with hospitalizations/illness:   Encourage verbalization of feelings/concerns/expectations   Encourage participation in diversional activity   Assist patient to identify own  strengths and abilities   Encourage patient to set small goals for self   Provide quiet environment   Include patient/ patient care companion in decisions     Problem: Compromised Tissue integrity  Goal: Damaged tissue is healing and protected  Outcome: Progressing  Flowsheets (Taken  10/31/2021 1127)  Damaged tissue is healing and protected:   Monitor/assess Braden scale every shift   Reposition patient every 2 hours and as needed unless able to reposition self   Increase activity as tolerated/progressive mobility   Keep intact skin clean and dry   Provide wound care per wound care algorithm  Goal: Nutritional status is improving  Outcome: Progressing  Flowsheets (Taken 10/31/2021 1127)  Nutritional status is improving:   Assist patient with eating   Allow adequate time for meals   Encourage patient to take dietary supplement(s) as ordered     Problem: Bladder/Voiding  Goal: Remains continent  Outcome: Progressing  Flowsheets (Taken 10/31/2021 1127)  Remains continent:   Initiate bladder training program   Encourage toileting   Monitor intake and output   Encourage intermittent catheterization per implemented schedule   Encourage patient to call for help when getting up to use the bathroom   Encourage patient to empty bladder at regular intervals  Goal: Perineal skin integrity is maintained or improved  Outcome: Progressing  Flowsheets (Taken 10/31/2021 1127)  Perineal skin integrity is maintained or improved:   Keep intact skin clean and dry   Apply urinary containment device as appropriate and/or per order   Use protective skin barriers to decrease potential skin breakdown  Goal: Free from infection  Outcome: Progressing  Flowsheets (Taken 10/31/2021 1127)  Free from infection:   Monitor/assess for signs and symptoms of infection   Assess need for indwelling catheter every shift and discuss with LIP  Goal: Patient will experience proper bladder emptying during admission  Outcome: Progressing  Flowsheets (Taken 10/31/2021 1127)  Patient will experience proper bladder emptying during admission:   Utilize bladder scans prior to or post void as appropriate   Encourage patient to empty bladder at regular intervals   Monitor intake and output   Patient will be in and out catheterized if unable to  void     Problem: Chest Pain  Goal: Vital signs and cardiac rhythm stable  Outcome: Progressing  Flowsheets (Taken 10/31/2021 1127)  Vital signs and cardiac rhythm stable:   Monitor /assess vital signs/cardiac rhythms   Assess the need for oxygen therapy and administer as ordered  Goal: Cardiac pain management  Outcome: Progressing  Flowsheets (Taken 10/31/2021 1127)  Cardiac pain management:   Assess pain/or related discomfort on admission, during daily assessment, before and after any intervention   Assess/report chest pain/or related discomfort to LIP immediately   Instruct patient to report any change in pain status  Goal: Anxiety management/effective coping  Outcome: Progressing  Flowsheets (Taken 10/31/2021 1127)  Anxiety management/effective coping:   Assess/report uncontrolled anxiety, depression or ineffective coping to LIP   Encourage patient to immediately report any increase in anxiety and/or depression   Offer reassurance to decrease anxiety  Goal: Patient/Patient Care Companion demonstrates understanding of disease process, treatment plan, medications, and discharge plan  Outcome: Progressing  Flowsheets (Taken 10/31/2021 1127)  Patient/Patient Care Companion demonstrates understanding of disease process, treatment plan, medications and discharge plan:   Educate patient to immediately report any chest pain/equivalent to RN   Reinforce with patient their activity level and to avoid Valsalva   Nutrition consult as needed

## 2021-10-31 NOTE — Plan of Care (Signed)
Clinical information reviewed.  Completing therapy for C.diff with Fidaxomicin.  Levaquin stopped.  Pt has been medically discharged and is awaiting SNF placement.  Will sign off, please call with questions.        Signed by Judie Petit.Lorel Monaco, MD

## 2021-10-31 NOTE — Progress Notes (Addendum)
Received transfer order to [facility]. Notified patient, all questions answered at this time. Report was called and given to RN Mitsy, all questions answered at this time. All belongings were gathered (including cell phone, purse, walker, pt's wheelchair, 2 large suitcases, and multiple bags) and were taken with patient. Transport via Doctor, general practice was requested.

## 2021-10-31 NOTE — PT Progress Note (Signed)
Physical Therapy Treatment  Jessica Mcbride   Post Acute Care Therapy Recommendations:   Discharge Recommendations: SNF    If SNF recommended discharge disposition is not available, patient will need hands on assist for mobility, skilled transport to destination and ADLs and home health PT services.     DME needs IF patient discharges home: No additional equipment/DME recommended at this time, Patient already has needed equipment    Therapy discharge recommendations may change with patient status.  Please refer to most recent note for up-to-date recommendations.  Assessment:   Significant Findings: none    Pt approached for skilled PT tx with RN consent, received semisupine in bed, agreeable to participate. Pt reports feeling better today, symptoms appear to be improving. Sit<>stands from different heights/surfaces with CGA to RW. Amb 40ft with CGA and RW. Slow, short, reciprocal gait. Reported dizziness towards end of walk, resolves upon sitting. Improving tolerance for mobility. Completed session seated in bedside chair. Pt will continue to benefit from skilled PT services to address balance and endurance impairments in order to maximize functional independence.       Assessment: Decreased safety/judgement during functional mobility, Decreased endurance/activity tolerance, Decreased balance, Gait impairment  Progress: Progressing toward goals  Prognosis: Good, With continued PT status post acute discharge  Risks/Benefits/POC Discussed with Pt/Family: With patient  Patient left without needs and call bell within reach. RN notified of session outcome.     Treatment Activities: gait training, pt education    Educated the patient to role of physical therapy, plan of care, goals of therapy and safety with mobility and ADLs.    Plan:   Treatment/Interventions: Exercise, Gait training, Neuromuscular re-education, Endurance training        PT Frequency: 2-3x/wk     Continue plan of care.     Unit: [facility] TOWER 6  Bed: F631/F631.01    Precautions and Contraindications:   Precautions  Weight Bearing Status: no restrictions  Other Precautions: Falls, monitor BP    Updated Medical Status/Imaging/Labs: Reviewed    Subjective: "I sat in the chair for 3 hours yesterday, that's how long it takes to get home."   Patient Goal: to feel better    Pain Assessment  Pain Assessment: No/denies pain    Patient's medical condition is appropriate for Physical Therapy intervention at this time.  Patient is agreeable to participation in the therapy session. Nursing clears patient for therapy.    Objective:   Observation of Patient/Vital Signs:  Patient is in bed with telemetry and peripheral IV in place.  Pt wore mask during therapy session:Yes    Cognition/Neuro Status  Arousal/Alertness: Appropriate responses to stimuli  Attention Span: Appears intact  Orientation Level: Oriented to place;Oriented to situation;Oriented to person  Following Commands: Follows all commands and directions without difficulty  Behavior: calm;cooperative         Functional Mobility:  Supine to Sit: Supervision  Scooting to EOB: Supervision  Sit to Stand: Risk analyst  Stand to Sit: Contact Guard Assist  Transfers  Bed to Chair: Risk analyst (Stand step)    Ambulation:  PMP - Progressive Mobility Protocol   PMP Activity: Step 7 - Walks out of Room  Distance Walked (ft) (Step 6,7): 60 Feet     Ambulation: Contact Guard Assist;with front-wheeled walker  Pattern: decreased cadence;decreased step length                    Patient Participation: good  Patient  Endurance: fair    Patient left with call bell within reach, all needs met, SCDs off, fall mat in place, chair alarm on and all questions answered. RN notified of session outcome and patient response.     Goals:  Goals  Goal Formulation: With patient  Time for Goal Acheivement: 5 visits  Goals: Select goal  Pt Will Perform Sit to Stand: with stand by assist  Pt Will Transfer  Bed/Chair: with rolling walker, with stand by assist  Pt Will Ambulate: 11-30 feet, with rolling walker, with stand by assist  Pt Will Propel Wheelchair: 51-150 feet, modified independent    PPE worn during session: procedural mask and gloves    Tech present: none  PPE worn by tech: N/A    Leavy Cella, PT, DPT Pager # [phone] 10/31/2021 12:03 PM       Time of Treatment  PT Received On: 10/31/21  Start Time: 1125  Stop Time: 1150  Time Calculation (min): 25 min  Treatment # 2 out of 5 visits

## 2021-10-31 NOTE — Plan of Care (Signed)
MEDICINE PLAN OF CARE    Date Time: 10/31/21 2:16 PM  Patient Name: Jessica Mcbride  Attending Physician: Roxana Hires, MD  [phone]  10/31/21  2:16 PM    Assessment:       Arrived to ED with BP 105/60, HR 95, RR 21, 95% on room air, afebrile. EKG shows sinus tachycardia, HR 111. Labs on admission were notable for WBC 16.74, H&H 10.7/34.5, PLT 525. UA noted large leukocytes, 1+ protein, large blood, RBC & WBC TNTC. blood and urine culture collected (Blood cx NGTD x 2 days and urine cx negative).  CRP 14, ESR 84.  CXR showed cardiomegaly, trace effusion and right perihilar opacities (Procalcitonin  0.15). CT T and L spine showed chronic compression fx of T12. CTA chest also completed and showed no evidence of PE.     Chest pain more consistent with MSK pain, fibromyalgia  and/or PMR flare. Started on multimodal pain medication. PTA Foley exchanged on 10/28/21.  Additionally ordered levofloxacin for UTI; however D/C'd after 3 doses per ID recommendations, foley exchange and repeat UA. As no sx UTI, suspect contamination. IV lasix  also given for suspected mild fluid overload contributing to hypoxia. Prednisone dose increased to 20 mg daily x 5 days for suspected PMR flare/pleurisy. 11/21-11/22 patient weaned from supplental oxygen and states pain is manageable. She remains hemodynamically stable and is medically discharged pending transfer to her living facility in [place]. After discharge patient is instructed to continue/complete 5 day prednisone burst of 20 mg, follow-up by PTA dose of 5 mg Prednisone thereafter. Additionally, patient is prescribed PRN oxycodone, Lidoderm, Tylenol,  and Robaxin for pain with PTA PRN Valium for anxiety. Her amlodipne was held as BP soft. Team also encouraged patient to follow-up with PCP within 1 week of discharge. Patient questions answered and she expressed understanding of discharge plan.      11/22-11/23: Patient noted no BM since 11/21. Daily Miralx added.  Patient had to Bms overnight. Abdomen is soft, non-tender, no gaurding present and bowel sounds normoactive on exam. No other events overnigth and patient remains hemodynamically stable and medically discharged pending transfer    Expected : Medically dsicharged. Awaiting transfer to [facility] in [place]  DVT ppx: LOVENOX and SCD  Interpreter: no, not indicated  Code Status: FULL CODE  Diet: Cardiac Diet and Consistant Carbohydrate Diet    VITALS   BP 138/74   Pulse 87   Temp 97.4 F (36.3 C) (Oral)   Resp 17   Ht 1.676 m (5\' 6" )   Wt 77.1 kg (170 lb)   SpO2 96%   BMI 27.44 kg/m   Body mass index is 27.44 kg/m.   Signed by: Leeanne Mannan, NP  Discussed in detail with attending: Roxana Hires, MD  Adult Observation Unit Nurse Practitioner  [phone] or [phone]

## 2021-10-31 NOTE — Progress Notes (Signed)
Case management delay:    Pt is pending insurance auth to [facility], as initiated on 10/29/21. If Berkley Harvey is not received prior to 11/02/21, pt will transport via [facility] to [facility] in [place] as previously planned.    Worker to follow.    Frederich Chick, LCSW  Case Management and Discharge Planning  [phone]

## 2021-11-01 MED ORDER — IBUPROFEN 600 MG PO TABS
600.0000 mg | ORAL_TABLET | Freq: Three times a day (TID) | ORAL | Status: DC | PRN
Start: 2021-11-01 — End: 2021-11-02
  Administered 2021-11-01 – 2021-11-02 (×3): 600 mg via ORAL
  Filled 2021-11-01 (×3): qty 1

## 2021-11-01 NOTE — Plan of Care (Signed)
Problem: Safety  Goal: Patient will be free from injury during hospitalization  Outcome: Progressing  Flowsheets (Taken 11/01/2021 1407)  Patient will be free from injury during hospitalization:   Assess patient's risk for falls and implement fall prevention plan of care per policy   Provide and maintain safe environment   Use appropriate transfer methods   Ensure appropriate safety devices are available at the bedside   Include patient/ family/ care giver in decisions related to safety   Hourly rounding   Assess for patients risk for elopement and implement Elopement Risk Plan per policy   Provide alternative method of communication if needed (communication boards, writing)  Goal: Patient will be free from infection during hospitalization  Outcome: Progressing  Flowsheets (Taken 11/01/2021 1407)  Free from Infection during hospitalization:   Assess and monitor for signs and symptoms of infection   Monitor lab/diagnostic results   Monitor all insertion sites (i.e. indwelling lines, tubes, urinary catheters, and drains)   Encourage patient and family to use good hand hygiene technique     Problem: Pain  Goal: Pain at adequate level as identified by patient  Outcome: Progressing  Flowsheets (Taken 11/01/2021 1407)  Pain at adequate level as identified by patient:   Identify patient comfort function goal   Assess for risk of opioid induced respiratory depression, including snoring/sleep apnea. Alert healthcare team of risk factors identified.   Assess pain on admission, during daily assessment and/or before any "as needed" intervention(s)   Reassess pain within 30-60 minutes of any procedure/intervention, per Pain Assessment, Intervention, Reassessment (AIR) Cycle   Evaluate if patient comfort function goal is met   Evaluate patient's satisfaction with pain management progress   Consult/collaborate with Physical Therapy, Occupational Therapy, and/or Speech Therapy   Include patient/patient care companion in decisions  related to pain management as needed   Offer non-pharmacological pain management interventions     Problem: Side Effects from Pain Analgesia  Goal: Patient will experience minimal side effects of analgesic therapy  Outcome: Progressing  Flowsheets (Taken 11/01/2021 1407)  Patient will experience minimal side effects of analgesic therapy:   Monitor/assess patient's respiratory status (RR depth, effort, breath sounds)   Assess for changes in cognitive function   Prevent/manage side effects per LIP orders (i.e. nausea, vomiting, pruritus, constipation, urinary retention, etc.)   Evaluate for opioid-induced sedation with appropriate assessment tool (i.e. POSS)     Problem: Chest Pain  Goal: Vital signs and cardiac rhythm stable  Outcome: Progressing  Flowsheets (Taken 11/01/2021 1407)  Vital signs and cardiac rhythm stable:   Monitor /assess vital signs/cardiac rhythms   Monitor labs   Assess the need for oxygen therapy and administer as ordered  Goal: Cardiac pain management  Outcome: Progressing  Flowsheets (Taken 11/01/2021 1407)  Cardiac pain management:   Assess/report chest pain/or related discomfort to LIP immediately   Instruct patient to report any change in pain status   Assess pain/or related discomfort on admission, during daily assessment, before and after any intervention   Include patient and patient care companion in decisions related to pain management  Goal: Anxiety management/effective coping  Outcome: Progressing  Flowsheets (Taken 11/01/2021 1407)  Anxiety management/effective coping:   Assess/report uncontrolled anxiety, depression or ineffective coping to LIP   Offer reassurance to decrease anxiety   Encourage patient to immediately report any increase in anxiety and/or depression   Include patient in decision making of their care and give updates on their health status  Goal: Patient/Patient Care Companion demonstrates  understanding of disease process, treatment plan, medications, and discharge  plan  Outcome: Progressing  Flowsheets (Taken 11/01/2021 1407)  Patient/Patient Care Companion demonstrates understanding of disease process, treatment plan, medications and discharge plan:   Educate patient to immediately report any chest pain/equivalent to RN   Reinforce with patient their activity level and to avoid Valsalva   Reinforce regarding cardiac diet and any fluid parameters   Nutrition consult as needed   Assist patient/patient care companion to identify measures for cardiac risk factor management

## 2021-11-01 NOTE — Plan of Care (Signed)
MEDICINE PLAN OF CARE    Date Time: 11/01/21 11:45 AM  Patient Name: Jessica Mcbride  Attending Physician: Roxana Hires, MD  [id number]  11/01/21  11:45 AM    Assessment:   Jessica Mcbride is a 87 y.o. female with a PMHx of PMR, temporal arteritis, myelodysplastic syndrome s/p BMT 1999, chronic anemia, HTN, CAD, hx of CVA, hypothyroidism, HDL, diverticulosis, CKD 3B, chronic chest pain, chronic orthostatic hypotension, urinary retention (PTA foley cath), C. Diff( failed oral vancomycin-on fidaxomicin), recent UTI, vertebral fractures, L2 kyphoplasty 9/22, admitted 10/27/2021 to observation for  who presents to the hosptial with left-sided chest pain, SOB and hypoxia.    EKG shows sinus tachycardia. Labs on admission were notable for WBC 16.74. UA noted large leukocytes, 1+ protein, large blood, RBC & WBC TNTC. blood and urine culture collected (Blood cx NGTD days and urine cx negative).  CRP 14, ESR 84.  CXR showed cardiomegaly, trace effusion and right perihilar opacities (Procalcitonin  0.15). CT T and L spine showed chronic compression fx of T12. CTA chest also completed and showed no evidence of PE.     Chest pain more consistent with MSK pain, fibromyalgia  and/or PMR flare. Started on multimodal pain medication. PTA Foley exchanged on 10/28/21.  Additionally ordered levofloxacin for UTI; however D/C'd after 3 doses per ID recommendations. IV lasix  also given for suspected mild fluid overload contributing to hypoxia. Prednisone dose increased to 20 mg daily x 5 days for suspected PMR flare/pleurisy. 11/21-11/22 patient weaned from supplental oxygen and states pain is manageable. She remains hemodynamically stable and is medically discharged pending transfer to her living facility in [place]. After discharge patient is instructed to continue/complete 5 day prednisone burst of 20 mg, follow-up by PTA dose of 5 mg Prednisone thereafter. Additionally, patient is prescribed PRN oxycodone,  Lidoderm, Tylenol,  and Robaxin for pain with PTA PRN Valium for anxiety. Her amlodipne was held as BP soft. Team also encouraged patient to follow-up with PCP within 1 week of discharge. Patient questions answered and she expressed understanding of discharge plan.     --Patient's chart reviewed, remains medically stable for discharge pending CM/SW placement.   --No acute events overnight. Ibuprofen started for ongoing CP. AVS reviewed & updated as needed.      Expected [place]: Medically dsicharged. Awaiting transfer to [facility] in [place]  DVT ppx: LOVENOX and SCD  Interpreter: no, not indicated  Code Status: FULL CODE  Diet: Cardiac Diet and Consistant Carbohydrate Diet    VITALS   BP 149/70    Pulse 100    Temp 98.6 F (37 C) (Axillary)    Resp 16    Ht 1.676 m (5\' 6" )    Wt 77.1 kg (170 lb)    SpO2 92%    BMI 27.44 kg/m   Body mass index is 27.44 kg/m.   Signed by: Melany Guernsey, FNP  Discussed in detail with attending: Roxana Hires, MD  Adult Observation Unit Nurse Practitioner  [phone] or [phone]

## 2021-11-01 NOTE — Plan of Care (Signed)
Neuro: alert to person, place, and situation. Intermittently forgetful  Pain: upper mid back pain decreased with scheduled pain meds and heat packs  Cardiac: No tele  Respiratory: room air   GI/GU: chronic foley in place. Incontinent stool. More than 2 BMs this shift  Diet: Regular  Musculoskeletal: bed mobility this shift.      Problem: Safety  Goal: Patient will be free from injury during hospitalization  Outcome: Progressing  Flowsheets (Taken 11/01/2021 0210)  Patient will be free from injury during hospitalization:   Assess patient's risk for falls and implement fall prevention plan of care per policy   Provide and maintain safe environment   Ensure appropriate safety devices are available at the bedside   Hourly rounding   Use appropriate transfer methods     Problem: Pain  Goal: Pain at adequate level as identified by patient  Outcome: Progressing  Flowsheets (Taken 11/01/2021 0210)  Pain at adequate level as identified by patient:   Identify patient comfort function goal   Assess for risk of opioid induced respiratory depression, including snoring/sleep apnea. Alert healthcare team of risk factors identified.   Assess pain on admission, during daily assessment and/or before any "as needed" intervention(s)   Reassess pain within 30-60 minutes of any procedure/intervention, per Pain Assessment, Intervention, Reassessment (AIR) Cycle   Evaluate if patient comfort function goal is met   Offer non-pharmacological pain management interventions     Problem: Side Effects from Pain Analgesia  Goal: Patient will experience minimal side effects of analgesic therapy  Outcome: Progressing  Flowsheets (Taken 11/01/2021 0210)  Patient will experience minimal side effects of analgesic therapy:   Monitor/assess patient's respiratory status (RR depth, effort, breath sounds)   Assess for changes in cognitive function   Evaluate for opioid-induced sedation with appropriate assessment tool (i.e. POSS)

## 2021-11-02 MED ORDER — ONDANSETRON HCL 4 MG PO TABS
4.0000 mg | ORAL_TABLET | Freq: Three times a day (TID) | ORAL | Status: DC | PRN
Start: 2021-11-02 — End: 2021-11-02
  Administered 2021-11-02: 4 mg via ORAL
  Filled 2021-11-02: qty 1

## 2021-11-02 NOTE — Progress Notes (Addendum)
Patient is being transported to [facility] via [facility].  CM contacted patient's daughter, Larene Beach [phone], who will inform [facility] that patient is been transferred to a different unit, info provided.    CM delivered discharge packet to nurses station and informed  summary will need to be placed in packet.

## 2021-11-02 NOTE — Plan of Care (Signed)
A&Ox4. VSS. On RA. C/o bilateral rib pain; prn and scheduled meds given with relief. No c/o SOB, dizziness, or nausea. Foley in place. Assist x1 with walker to the bathroom. Safety precautions in place. Continue with plan of care. Pt is in bed resting.     Problem: Pain  Goal: Pain at adequate level as identified by patient  Outcome: Progressing  Flowsheets (Taken 11/02/2021 0202)  Pain at adequate level as identified by patient:   Identify patient comfort function goal   Assess for risk of opioid induced respiratory depression, including snoring/sleep apnea. Alert healthcare team of risk factors identified.   Assess pain on admission, during daily assessment and/or before any "as needed" intervention(s)   Reassess pain within 30-60 minutes of any procedure/intervention, per Pain Assessment, Intervention, Reassessment (AIR) Cycle   Evaluate if patient comfort function goal is met   Evaluate patient's satisfaction with pain management progress   Consult/collaborate with Pain Service   Offer non-pharmacological pain management interventions     Problem: Side Effects from Pain Analgesia  Goal: Patient will experience minimal side effects of analgesic therapy  Outcome: Progressing  Flowsheets (Taken 11/02/2021 0202)  Patient will experience minimal side effects of analgesic therapy:   Monitor/assess patient's respiratory status (RR depth, effort, breath sounds)   Assess for changes in cognitive function   Prevent/manage side effects per LIP orders (i.e. nausea, vomiting, pruritus, constipation, urinary retention, etc.)   Evaluate for opioid-induced sedation with appropriate assessment tool (i.e. POSS)     Problem: Chest Pain  Goal: Anxiety management/effective coping  Outcome: Progressing  Flowsheets (Taken 11/02/2021 0202)  Anxiety management/effective coping:   Assess/report uncontrolled anxiety, depression or ineffective coping to LIP   Offer reassurance to decrease anxiety   Encourage patient to immediately report  any increase in anxiety and/or depression   Include patient in decision making of their care and give updates on their health status

## 2021-11-02 NOTE — Progress Notes (Signed)
11/02/21 0904   Discharge Disposition   Patient preference/choice provided? Yes   Physical Discharge Disposition Assisted Living Facility   Name of Assisted Living Facility [facility]   Receiving facility, unit and room number: [facility]   Nursing report phone number: [phone]   Mode of Transportation Ambulance   Pick up time 2:00   Patient/Family/POA notified of transfer plan Yes   Patient agreeable to discharge plan/expected d/c date? Yes   Family/POA agreeable to discharge plan/expected d/c date? Yes   Bedside nurse notified of transport plan? Yes   CM Interventions   Notified MD? Yes   Is this a Medicare focused or COVID patient? No   Is this appointment within 48-72 hours? No   Multidisciplinary rounds/family meeting before d/c? Yes   Medicare Checklist   Is this a Medicare patient? Yes   Patient received 1st IMM Letter? Yes     Frederich Chick, LCSW  Case Management and Discharge Planning  [phone]

## 2021-11-02 NOTE — Plan of Care (Signed)
MEDICINE PLAN OF CARE    Date Time: 11/02/21 8:34 AM  Patient Name: Jessica Mcbride  Attending Physician: Roxana Hires, MD  [id number]  11/02/21  8:34 AM    Assessment:   Jessica Mcbride is a 87 y.o. female with a PMHx of PMR, temporal arteritis, myelodysplastic syndrome s/p BMT 1999, chronic anemia, HTN, CAD, hx of CVA, hypothyroidism, HDL, diverticulosis, CKD 3B, chronic chest pain, chronic orthostatic hypotension, urinary retention (PTA foley cath), C. Diff( failed oral vancomycin-on fidaxomicin), recent UTI, vertebral fractures, L2 kyphoplasty 9/22, admitted 10/27/2021 to observation for  who presents to the hosptial with left-sided chest pain, SOB and hypoxia.    EKG shows sinus tachycardia. Labs on admission were notable for WBC 16.74. UA noted large leukocytes, 1+ protein, large blood, RBC & WBC TNTC. blood and urine culture collected (Blood cx NGTD days and urine cx negative).  CRP 14, ESR 84.  CXR showed cardiomegaly, trace effusion and right perihilar opacities (Procalcitonin  0.15). CT T and L spine showed chronic compression fx of T12. CTA chest also completed and showed no evidence of PE.     Chest pain more consistent with MSK pain, fibromyalgia  and/or PMR flare. Started on multimodal pain medication. PTA Foley exchanged on 10/28/21.  Additionally ordered levofloxacin for UTI; however D/C'd after 3 doses per ID recommendations. IV lasix  also given for suspected mild fluid overload contributing to hypoxia. Prednisone dose increased to 20 mg daily x 5 days for suspected PMR flare/pleurisy. 11/21-11/22 patient weaned from supplental oxygen and states pain is manageable. She remains hemodynamically stable and is medically discharged pending transfer to her living facility in [place]. After discharge patient is instructed to continue/complete 5 day prednisone burst of 20 mg, follow-up by PTA dose of 5 mg Prednisone thereafter. Additionally, patient is prescribed PRN oxycodone,  Lidoderm, Tylenol,  and Robaxin for pain with PTA PRN Valium for anxiety. Her amlodipne was held as BP soft. Team also encouraged patient to follow-up with PCP within 1 week of discharge. Patient questions answered and she expressed understanding of discharge plan.     --Patient's chart reviewed, remains medically stable for discharge pending ride to facility today at 2pm   --No acute events overnight. AVS reviewed & updated as needed.      Expected [place]: Medically dsicharged. Awaiting transfer to [facility] in [place]  DVT ppx: LOVENOX and SCD  Interpreter: no, not indicated  Code Status: FULL CODE  Diet: Cardiac Diet and Consistant Carbohydrate Diet    VITALS   BP 158/69   Pulse 85   Temp 98.2 F (36.8 C) (Oral)   Resp 17   Ht 1.676 m (5\' 6" )   Wt 77.1 kg (170 lb)   SpO2 96%   BMI 27.44 kg/m   Body mass index is 27.44 kg/m.   Signed by: Melany Guernsey, FNP  Discussed in detail with attending: Roxana Hires, MD  Adult Observation Unit Nurse Practitioner  [phone] or [phone]

## 2021-11-02 NOTE — Progress Notes (Signed)
1430: Pt picked up by [facility]. Pt was medicated with pain med PRN and Valium. Pt was dressed and all belongings were sent with Pt

## 2021-11-02 NOTE — Progress Notes (Addendum)
Pt IV infiltrated. Confirmed with Dr. Blenda Peals that staff does not need to insert another IV due to pt discharging today 11/25 at 1400.

## 2021-11-02 NOTE — Plan of Care (Signed)
Problem: Safety  Goal: Patient will be free from injury during hospitalization  Outcome: Progressing  Flowsheets (Taken 11/01/2021 1407)  Patient will be free from injury during hospitalization:   Assess patient's risk for falls and implement fall prevention plan of care per policy   Provide and maintain safe environment   Use appropriate transfer methods   Ensure appropriate safety devices are available at the bedside   Include patient/ family/ care giver in decisions related to safety   Hourly rounding   Assess for patients risk for elopement and implement Elopement Risk Plan per policy   Provide alternative method of communication if needed (communication boards, writing)  Goal: Patient will be free from infection during hospitalization  Outcome: Progressing  Flowsheets (Taken 11/01/2021 1407)  Free from Infection during hospitalization:   Assess and monitor for signs and symptoms of infection   Monitor lab/diagnostic results   Monitor all insertion sites (i.e. indwelling lines, tubes, urinary catheters, and drains)   Encourage patient and family to use good hand hygiene technique     Problem: Pain  Goal: Pain at adequate level as identified by patient  Outcome: Progressing  Flowsheets (Taken 11/02/2021 1112)  Pain at adequate level as identified by patient:   Identify patient comfort function goal   Assess for risk of opioid induced respiratory depression, including snoring/sleep apnea. Alert healthcare team of risk factors identified.   Assess pain on admission, during daily assessment and/or before any "as needed" intervention(s)   Reassess pain within 30-60 minutes of any procedure/intervention, per Pain Assessment, Intervention, Reassessment (AIR) Cycle   Evaluate if patient comfort function goal is met   Evaluate patient's satisfaction with pain management progress   Offer non-pharmacological pain management interventions   Consult/collaborate with Physical Therapy, Occupational Therapy, and/or Speech  Therapy     Problem: Side Effects from Pain Analgesia  Goal: Patient will experience minimal side effects of analgesic therapy  Outcome: Progressing  Flowsheets (Taken 11/02/2021 1112)  Patient will experience minimal side effects of analgesic therapy:   Monitor/assess patient's respiratory status (RR depth, effort, breath sounds)   Assess for changes in cognitive function   Prevent/manage side effects per LIP orders (i.e. nausea, vomiting, pruritus, constipation, urinary retention, etc.)   Evaluate for opioid-induced sedation with appropriate assessment tool (i.e. POSS)
